# Patient Record
Sex: Female | Born: 1955
Health system: Southern US, Community
[De-identification: ages and names within clinical notes are randomized; demographics above are authoritative.]

## PROBLEM LIST (undated history)

## (undated) DIAGNOSIS — I4510 Unspecified right bundle-branch block: Secondary | ICD-10-CM

## (undated) DIAGNOSIS — G629 Polyneuropathy, unspecified: Secondary | ICD-10-CM

## (undated) DIAGNOSIS — G514 Facial myokymia: Secondary | ICD-10-CM

## (undated) DIAGNOSIS — G930 Cerebral cysts: Secondary | ICD-10-CM

## (undated) DIAGNOSIS — E559 Vitamin D deficiency, unspecified: Secondary | ICD-10-CM

## (undated) HISTORY — DX: Polyneuropathy, unspecified: G62.9

## (undated) HISTORY — DX: Cerebral cysts: G93.0

## (undated) HISTORY — DX: Facial myokymia: G51.4

## (undated) HISTORY — PX: OTHER SURGICAL HISTORY: SHX169

## (undated) HISTORY — PX: TONSILLECTOMY: SUR1361

## (undated) HISTORY — DX: Unspecified right bundle-branch block: I45.10

## (undated) HISTORY — DX: Vitamin D deficiency, unspecified: E55.9

## (undated) HISTORY — PX: BREAST EXCISIONAL BIOPSY: SUR124

## (undated) HISTORY — PX: PILONIDAL CYST EXCISION: SHX744

---

## 1997-06-15 ENCOUNTER — Other Ambulatory Visit: Admission: RE | Admit: 1997-06-15 | Discharge: 1997-06-15 | Payer: Self-pay | Admitting: Obstetrics and Gynecology

## 1997-07-21 ENCOUNTER — Other Ambulatory Visit: Admission: RE | Admit: 1997-07-21 | Discharge: 1997-07-21 | Payer: Self-pay | Admitting: Obstetrics and Gynecology

## 1998-06-23 ENCOUNTER — Encounter
Admission: RE | Admit: 1998-06-23 | Discharge: 1998-07-08 | Payer: Self-pay | Admitting: Physical Medicine & Rehabilitation

## 1998-08-25 ENCOUNTER — Other Ambulatory Visit: Admission: RE | Admit: 1998-08-25 | Discharge: 1998-08-25 | Payer: Self-pay | Admitting: Obstetrics and Gynecology

## 1999-08-10 ENCOUNTER — Other Ambulatory Visit: Admission: RE | Admit: 1999-08-10 | Discharge: 1999-08-10 | Payer: Self-pay | Admitting: Family Medicine

## 2000-08-19 ENCOUNTER — Encounter: Admission: RE | Admit: 2000-08-19 | Discharge: 2000-08-19 | Payer: Self-pay | Admitting: Family Medicine

## 2000-08-19 ENCOUNTER — Encounter: Payer: Self-pay | Admitting: Family Medicine

## 2000-08-27 ENCOUNTER — Other Ambulatory Visit: Admission: RE | Admit: 2000-08-27 | Discharge: 2000-08-27 | Payer: Self-pay | Admitting: Family Medicine

## 2001-09-01 ENCOUNTER — Other Ambulatory Visit: Admission: RE | Admit: 2001-09-01 | Discharge: 2001-09-01 | Payer: Self-pay | Admitting: Family Medicine

## 2002-10-21 ENCOUNTER — Other Ambulatory Visit: Admission: RE | Admit: 2002-10-21 | Discharge: 2002-10-21 | Payer: Self-pay | Admitting: Family Medicine

## 2002-12-03 ENCOUNTER — Encounter: Payer: Self-pay | Admitting: Family Medicine

## 2002-12-03 ENCOUNTER — Encounter: Admission: RE | Admit: 2002-12-03 | Discharge: 2002-12-03 | Payer: Self-pay | Admitting: Family Medicine

## 2003-12-27 ENCOUNTER — Other Ambulatory Visit: Admission: RE | Admit: 2003-12-27 | Discharge: 2003-12-27 | Payer: Self-pay | Admitting: Family Medicine

## 2004-05-31 ENCOUNTER — Ambulatory Visit (HOSPITAL_COMMUNITY): Admission: RE | Admit: 2004-05-31 | Discharge: 2004-05-31 | Payer: Self-pay | Admitting: *Deleted

## 2004-07-27 ENCOUNTER — Ambulatory Visit (HOSPITAL_COMMUNITY): Admission: RE | Admit: 2004-07-27 | Discharge: 2004-07-27 | Payer: Self-pay | Admitting: Neurosurgery

## 2004-12-28 ENCOUNTER — Encounter: Admission: RE | Admit: 2004-12-28 | Discharge: 2004-12-28 | Payer: Self-pay | Admitting: Family Medicine

## 2005-01-11 ENCOUNTER — Other Ambulatory Visit: Admission: RE | Admit: 2005-01-11 | Discharge: 2005-01-11 | Payer: Self-pay | Admitting: Family Medicine

## 2005-01-15 ENCOUNTER — Ambulatory Visit (HOSPITAL_COMMUNITY): Admission: RE | Admit: 2005-01-15 | Discharge: 2005-01-15 | Payer: Self-pay | Admitting: Family Medicine

## 2005-07-28 ENCOUNTER — Ambulatory Visit (HOSPITAL_COMMUNITY): Admission: RE | Admit: 2005-07-28 | Discharge: 2005-07-28 | Payer: Self-pay | Admitting: Neurosurgery

## 2006-10-10 ENCOUNTER — Encounter: Admission: RE | Admit: 2006-10-10 | Discharge: 2006-10-10 | Payer: Self-pay | Admitting: Family Medicine

## 2006-10-24 ENCOUNTER — Encounter: Admission: RE | Admit: 2006-10-24 | Discharge: 2006-10-24 | Payer: Self-pay | Admitting: Family Medicine

## 2007-01-30 ENCOUNTER — Encounter: Admission: RE | Admit: 2007-01-30 | Discharge: 2007-01-30 | Payer: Self-pay | Admitting: Family Medicine

## 2007-12-18 ENCOUNTER — Encounter: Admission: RE | Admit: 2007-12-18 | Discharge: 2007-12-18 | Payer: Self-pay | Admitting: Family Medicine

## 2008-06-30 ENCOUNTER — Encounter: Admission: RE | Admit: 2008-06-30 | Discharge: 2008-06-30 | Payer: Self-pay | Admitting: Occupational Medicine

## 2009-06-02 ENCOUNTER — Encounter: Admission: RE | Admit: 2009-06-02 | Discharge: 2009-06-02 | Payer: Self-pay | Admitting: Family Medicine

## 2010-06-08 ENCOUNTER — Other Ambulatory Visit: Payer: Self-pay | Admitting: Gastroenterology

## 2010-06-08 ENCOUNTER — Ambulatory Visit (HOSPITAL_COMMUNITY)
Admission: RE | Admit: 2010-06-08 | Discharge: 2010-06-08 | Disposition: A | Discharge status: Home / Self Care | Payer: Managed Care, Other (non HMO) | Source: Ambulatory Visit | Attending: Gastroenterology | Admitting: Gastroenterology

## 2010-06-08 DIAGNOSIS — Z1211 Encounter for screening for malignant neoplasm of colon: Secondary | ICD-10-CM | POA: Insufficient documentation

## 2010-06-08 DIAGNOSIS — D126 Benign neoplasm of colon, unspecified: Secondary | ICD-10-CM | POA: Insufficient documentation

## 2010-06-25 NOTE — Op Note (Signed)
  NAMEADALYN, Kimberly Jacobs NO.:  [id number]  MEDICAL RECORD NO.:  [id number]           PATIENT TYPE:  O  LOCATION:  [facility]                         FACILITY:  [facility]  PHYSICIAN:  Danise Edge, M.D.   DATE OF BIRTH:  15-Mar-1955  DATE OF PROCEDURE:  06/08/2010 DATE OF DISCHARGE:                              OPERATIVE REPORT   HISTORY:  [name] is a 55 year old female born 1955/12/23.  The patient is scheduled to undergo her first screening colonoscopy with polypectomy to prevent colon cancer.  ENDOSCOPIST:  Danise Edge, M.D.  PREMEDICATION:  Fentanyl 100 mcg, Versed 10 mg.  PROCEDURE:  After obtaining informed consent, the patient was placed in the left lateral decubitus position.  Anal inspection and digital rectal examination were normal.  The Pentax pediatric colonoscope was introduced into the rectum and easily advanced to the cecum.  A normal- appearing ileocecal valve and appendiceal orifice were identified. Colonic preparation for the examination today was good.  Rectum normal.  Retroflexed view of the distal rectum normal. Sigmoid colon and descending colon.  From the distal sigmoid colon a 3- mm sessile polyp was removed with the cold biopsy forceps. Splenic flexure normal. Transverse colon normal. Hepatic flexure normal. Ascending colon normal. Cecum and ileocecal valve normal.  ASSESSMENT:  A diminutive polyp was removed from the distal sigmoid colon with the cold biopsy forceps.  Otherwise normal screening colonoscopy to the cecum.  RECOMMENDATIONS:  If the sigmoid colon polyp returns neoplastic pathologically, the patient should undergo a surveillance colonoscopy in 5 years.  If the distal sigmoid colon polyp returns non-neoplastic pathologically, the patient should undergo a screening colonoscopy in 10 years.          ______________________________ Danise Edge, M.D.     MJ/MEDQ  D:  06/08/2010  T:  06/08/2010  Job:   [id number]  cc:   Juluis Rainier, M.D. Fax: [phone]  Electronically Signed by Danise Edge M.D. on 06/25/2010 08:52:33 AM

## 2011-08-10 ENCOUNTER — Other Ambulatory Visit: Payer: Self-pay | Admitting: Family Medicine

## 2011-08-10 DIAGNOSIS — Z1231 Encounter for screening mammogram for malignant neoplasm of breast: Secondary | ICD-10-CM

## 2011-08-10 DIAGNOSIS — Z780 Asymptomatic menopausal state: Secondary | ICD-10-CM

## 2011-08-24 ENCOUNTER — Ambulatory Visit
Admission: RE | Admit: 2011-08-24 | Discharge: 2011-08-24 | Disposition: A | Discharge status: Home / Self Care | Payer: 59 | Source: Ambulatory Visit | Attending: Family Medicine | Admitting: Family Medicine

## 2011-08-24 DIAGNOSIS — Z1231 Encounter for screening mammogram for malignant neoplasm of breast: Secondary | ICD-10-CM

## 2011-08-24 DIAGNOSIS — Z780 Asymptomatic menopausal state: Secondary | ICD-10-CM

## 2012-11-03 ENCOUNTER — Other Ambulatory Visit: Payer: Self-pay

## 2012-11-03 DIAGNOSIS — Z1231 Encounter for screening mammogram for malignant neoplasm of breast: Secondary | ICD-10-CM

## 2012-11-13 ENCOUNTER — Other Ambulatory Visit (HOSPITAL_COMMUNITY)
Admission: RE | Admit: 2012-11-13 | Discharge: 2012-11-13 | Disposition: A | Discharge status: Home / Self Care | Payer: 59 | Source: Ambulatory Visit | Attending: Family Medicine | Admitting: Family Medicine

## 2012-11-13 ENCOUNTER — Other Ambulatory Visit: Payer: Self-pay | Admitting: Family Medicine

## 2012-11-13 DIAGNOSIS — Z124 Encounter for screening for malignant neoplasm of cervix: Secondary | ICD-10-CM | POA: Insufficient documentation

## 2012-11-27 ENCOUNTER — Ambulatory Visit: Payer: 59

## 2012-12-11 ENCOUNTER — Ambulatory Visit: Payer: 59

## 2013-01-01 ENCOUNTER — Ambulatory Visit
Admission: RE | Admit: 2013-01-01 | Discharge: 2013-01-01 | Disposition: A | Discharge status: Home / Self Care | Payer: 59 | Source: Ambulatory Visit

## 2013-01-01 DIAGNOSIS — Z1231 Encounter for screening mammogram for malignant neoplasm of breast: Secondary | ICD-10-CM

## 2013-04-30 ENCOUNTER — Other Ambulatory Visit: Payer: Self-pay | Admitting: Obstetrics and Gynecology

## 2013-09-04 ENCOUNTER — Other Ambulatory Visit: Payer: Self-pay | Admitting: Obstetrics and Gynecology

## 2013-09-04 DIAGNOSIS — N63 Unspecified lump in unspecified breast: Secondary | ICD-10-CM

## 2013-09-07 ENCOUNTER — Encounter (INDEPENDENT_AMBULATORY_CARE_PROVIDER_SITE_OTHER): Payer: Self-pay

## 2013-09-07 ENCOUNTER — Ambulatory Visit
Admission: RE | Admit: 2013-09-07 | Discharge: 2013-09-07 | Disposition: A | Discharge status: Home / Self Care | Payer: 59 | Source: Ambulatory Visit | Attending: Obstetrics and Gynecology | Admitting: Obstetrics and Gynecology

## 2013-09-07 DIAGNOSIS — N63 Unspecified lump in unspecified breast: Secondary | ICD-10-CM

## 2014-02-18 ENCOUNTER — Other Ambulatory Visit: Payer: Self-pay

## 2014-03-22 ENCOUNTER — Other Ambulatory Visit: Payer: Self-pay

## 2014-03-22 DIAGNOSIS — Z1231 Encounter for screening mammogram for malignant neoplasm of breast: Secondary | ICD-10-CM

## 2014-04-01 ENCOUNTER — Ambulatory Visit
Admission: RE | Admit: 2014-04-01 | Discharge: 2014-04-01 | Disposition: A | Discharge status: Home / Self Care | Payer: 59 | Source: Ambulatory Visit

## 2014-04-01 DIAGNOSIS — Z1231 Encounter for screening mammogram for malignant neoplasm of breast: Secondary | ICD-10-CM

## 2014-04-02 ENCOUNTER — Other Ambulatory Visit: Payer: Self-pay | Admitting: Obstetrics and Gynecology

## 2014-04-02 DIAGNOSIS — R928 Other abnormal and inconclusive findings on diagnostic imaging of breast: Secondary | ICD-10-CM

## 2014-04-12 ENCOUNTER — Ambulatory Visit
Admission: RE | Admit: 2014-04-12 | Discharge: 2014-04-12 | Disposition: A | Discharge status: Home / Self Care | Payer: 59 | Source: Ambulatory Visit | Attending: Obstetrics and Gynecology | Admitting: Obstetrics and Gynecology

## 2014-04-12 DIAGNOSIS — R928 Other abnormal and inconclusive findings on diagnostic imaging of breast: Secondary | ICD-10-CM

## 2014-04-15 ENCOUNTER — Other Ambulatory Visit: Payer: Self-pay | Admitting: Obstetrics and Gynecology

## 2014-04-16 LAB — CYTOLOGY - PAP

## 2014-09-02 ENCOUNTER — Other Ambulatory Visit: Payer: Self-pay | Admitting: Obstetrics and Gynecology

## 2014-09-02 DIAGNOSIS — N63 Unspecified lump in unspecified breast: Secondary | ICD-10-CM

## 2014-10-14 ENCOUNTER — Ambulatory Visit
Admission: RE | Admit: 2014-10-14 | Discharge: 2014-10-14 | Disposition: A | Discharge status: Home / Self Care | Payer: 59 | Source: Ambulatory Visit | Attending: Obstetrics and Gynecology | Admitting: Obstetrics and Gynecology

## 2014-10-14 DIAGNOSIS — N63 Unspecified lump in unspecified breast: Secondary | ICD-10-CM

## 2015-03-11 MED FILL — ESTRADIOL PATCH 0.0375: 0.0375 | 84 days supply | Qty: 24 | Fill #0

## 2015-05-05 ENCOUNTER — Other Ambulatory Visit: Payer: Self-pay | Admitting: Obstetrics and Gynecology

## 2015-05-05 DIAGNOSIS — N6310 Unspecified lump in the right breast, unspecified quadrant: Secondary | ICD-10-CM

## 2015-05-12 ENCOUNTER — Ambulatory Visit
Admission: RE | Admit: 2015-05-12 | Discharge: 2015-05-12 | Disposition: A | Discharge status: Home / Self Care | Payer: 59 | Source: Ambulatory Visit | Attending: Obstetrics and Gynecology | Admitting: Obstetrics and Gynecology

## 2015-05-12 ENCOUNTER — Other Ambulatory Visit: Payer: Self-pay | Admitting: Obstetrics and Gynecology

## 2015-05-12 DIAGNOSIS — N6310 Unspecified lump in the right breast, unspecified quadrant: Secondary | ICD-10-CM

## 2015-05-12 DIAGNOSIS — N63 Unspecified lump in breast: Secondary | ICD-10-CM | POA: Diagnosis not present

## 2015-05-16 ENCOUNTER — Other Ambulatory Visit: Payer: Self-pay | Admitting: Obstetrics and Gynecology

## 2015-05-16 DIAGNOSIS — Z124 Encounter for screening for malignant neoplasm of cervix: Secondary | ICD-10-CM | POA: Diagnosis not present

## 2015-05-16 DIAGNOSIS — Z681 Body mass index (BMI) 19 or less, adult: Secondary | ICD-10-CM | POA: Diagnosis not present

## 2015-05-16 DIAGNOSIS — Z01419 Encounter for gynecological examination (general) (routine) without abnormal findings: Secondary | ICD-10-CM | POA: Diagnosis not present

## 2015-05-17 LAB — CYTOLOGY - PAP

## 2015-05-24 ENCOUNTER — Ambulatory Visit
Admission: RE | Admit: 2015-05-24 | Discharge: 2015-05-24 | Disposition: A | Discharge status: Home / Self Care | Payer: 59 | Source: Ambulatory Visit | Attending: Obstetrics and Gynecology | Admitting: Obstetrics and Gynecology

## 2015-05-24 ENCOUNTER — Other Ambulatory Visit: Payer: Self-pay | Admitting: Obstetrics and Gynecology

## 2015-05-24 DIAGNOSIS — N6310 Unspecified lump in the right breast, unspecified quadrant: Secondary | ICD-10-CM

## 2015-05-24 DIAGNOSIS — N6011 Diffuse cystic mastopathy of right breast: Secondary | ICD-10-CM | POA: Diagnosis not present

## 2015-05-24 DIAGNOSIS — N63 Unspecified lump in breast: Secondary | ICD-10-CM | POA: Diagnosis not present

## 2015-06-08 MED FILL — PROGESTERONE 200 MG CAPSULE: 200 | 90 days supply | Qty: 90 | Fill #2

## 2015-06-08 MED FILL — ESTRADIOL PATCH 0.0375: 0.0375 | 84 days supply | Qty: 24 | Fill #0

## 2015-06-13 DIAGNOSIS — N950 Postmenopausal bleeding: Secondary | ICD-10-CM | POA: Diagnosis not present

## 2015-07-29 ENCOUNTER — Other Ambulatory Visit: Payer: Self-pay | Admitting: Obstetrics and Gynecology

## 2015-08-03 DIAGNOSIS — Z01810 Encounter for preprocedural cardiovascular examination: Secondary | ICD-10-CM | POA: Diagnosis not present

## 2015-08-03 DIAGNOSIS — R9431 Abnormal electrocardiogram [ECG] [EKG]: Secondary | ICD-10-CM | POA: Diagnosis not present

## 2015-08-03 DIAGNOSIS — N939 Abnormal uterine and vaginal bleeding, unspecified: Secondary | ICD-10-CM | POA: Diagnosis not present

## 2015-08-03 DIAGNOSIS — R011 Cardiac murmur, unspecified: Secondary | ICD-10-CM | POA: Diagnosis not present

## 2015-08-19 DIAGNOSIS — R9431 Abnormal electrocardiogram [ECG] [EKG]: Secondary | ICD-10-CM | POA: Diagnosis not present

## 2015-08-19 DIAGNOSIS — Z01810 Encounter for preprocedural cardiovascular examination: Secondary | ICD-10-CM | POA: Diagnosis not present

## 2015-09-05 DIAGNOSIS — R9431 Abnormal electrocardiogram [ECG] [EKG]: Secondary | ICD-10-CM | POA: Diagnosis not present

## 2015-09-05 DIAGNOSIS — Z01810 Encounter for preprocedural cardiovascular examination: Secondary | ICD-10-CM | POA: Diagnosis not present

## 2015-09-23 MED FILL — PROGESTERONE 200 MG CAPSULE: 200 | 90 days supply | Qty: 90 | Fill #3

## 2015-11-08 ENCOUNTER — Other Ambulatory Visit: Payer: Self-pay | Admitting: Obstetrics and Gynecology

## 2015-11-08 DIAGNOSIS — N950 Postmenopausal bleeding: Secondary | ICD-10-CM | POA: Diagnosis not present

## 2015-11-08 DIAGNOSIS — N858 Other specified noninflammatory disorders of uterus: Secondary | ICD-10-CM | POA: Diagnosis not present

## 2015-11-24 DIAGNOSIS — Z0000 Encounter for general adult medical examination without abnormal findings: Secondary | ICD-10-CM | POA: Diagnosis not present

## 2015-11-24 DIAGNOSIS — E559 Vitamin D deficiency, unspecified: Secondary | ICD-10-CM | POA: Diagnosis not present

## 2015-11-24 DIAGNOSIS — R7301 Impaired fasting glucose: Secondary | ICD-10-CM | POA: Diagnosis not present

## 2015-11-24 DIAGNOSIS — G629 Polyneuropathy, unspecified: Secondary | ICD-10-CM | POA: Diagnosis not present

## 2015-11-24 DIAGNOSIS — Z79890 Hormone replacement therapy (postmenopausal): Secondary | ICD-10-CM | POA: Diagnosis not present

## 2015-11-28 MED FILL — ESTRADIOL 0.025 MG PATCH: 0.025 | 84 days supply | Qty: 24 | Fill #0

## 2015-12-29 MED FILL — PROGESTERONE 200 MG CAPSULE: 200 | 90 days supply | Qty: 90 | Fill #0

## 2016-03-28 MED FILL — PROGESTERONE 200 MG CAPSULE: 200 | 90 days supply | Qty: 90 | Fill #1

## 2016-03-28 MED FILL — ESTRADIOL 0.025 MG PATCH: 0.025 | 84 days supply | Qty: 24 | Fill #1

## 2016-06-25 MED FILL — ESTRADIOL 0.025 MG PATCH: 0.025 | 84 days supply | Qty: 24 | Fill #0

## 2016-06-25 MED FILL — PROGESTERONE 200 MG CAPSULE: 200 | 90 days supply | Qty: 90 | Fill #0

## 2016-07-19 MED FILL — CLINDAMYCIN HCL 300 MG CAP: 300 | 6 days supply | Qty: 21 | Fill #0

## 2016-09-24 MED FILL — PROGESTERONE 200 MG CAPSULE: 200 | 90 days supply | Qty: 90 | Fill #1

## 2016-09-24 MED FILL — ESTRADIOL 0.025 MG PATCH: 0.025 | 84 days supply | Qty: 24 | Fill #1

## 2016-10-03 DIAGNOSIS — N898 Other specified noninflammatory disorders of vagina: Secondary | ICD-10-CM | POA: Diagnosis not present

## 2016-10-03 DIAGNOSIS — R350 Frequency of micturition: Secondary | ICD-10-CM | POA: Diagnosis not present

## 2016-10-11 MED FILL — PREMARIN VAGINAL CREAM-APPL: 0.625 | 90 days supply | Qty: 30 | Fill #0

## 2016-11-29 DIAGNOSIS — Z0000 Encounter for general adult medical examination without abnormal findings: Secondary | ICD-10-CM | POA: Diagnosis not present

## 2016-11-29 DIAGNOSIS — R7301 Impaired fasting glucose: Secondary | ICD-10-CM | POA: Diagnosis not present

## 2016-11-29 DIAGNOSIS — E2839 Other primary ovarian failure: Secondary | ICD-10-CM | POA: Diagnosis not present

## 2016-11-29 DIAGNOSIS — E559 Vitamin D deficiency, unspecified: Secondary | ICD-10-CM | POA: Diagnosis not present

## 2016-11-29 MED FILL — HYDROCORT-PRAMOXINE 2.5-1%: 2.5-1 | 10 days supply | Qty: 30 | Fill #0

## 2016-12-03 ENCOUNTER — Other Ambulatory Visit: Payer: Self-pay | Admitting: Family Medicine

## 2016-12-03 DIAGNOSIS — E2839 Other primary ovarian failure: Secondary | ICD-10-CM

## 2016-12-03 DIAGNOSIS — Z1231 Encounter for screening mammogram for malignant neoplasm of breast: Secondary | ICD-10-CM

## 2016-12-24 MED FILL — PROGESTERONE 200 MG CAPSULE: 200 | 90 days supply | Qty: 90 | Fill #0

## 2016-12-24 MED FILL — ESTRADIOL 0.025 MG PATCH: 0.025 | 84 days supply | Qty: 24 | Fill #0

## 2016-12-26 ENCOUNTER — Ambulatory Visit
Admission: RE | Admit: 2016-12-26 | Discharge: 2016-12-26 | Disposition: A | Discharge status: Home / Self Care | Payer: 59 | Source: Ambulatory Visit | Attending: Family Medicine | Admitting: Family Medicine

## 2016-12-26 DIAGNOSIS — Z13820 Encounter for screening for osteoporosis: Secondary | ICD-10-CM | POA: Diagnosis not present

## 2016-12-26 DIAGNOSIS — Z1231 Encounter for screening mammogram for malignant neoplasm of breast: Secondary | ICD-10-CM

## 2016-12-26 DIAGNOSIS — Z780 Asymptomatic menopausal state: Secondary | ICD-10-CM | POA: Diagnosis not present

## 2016-12-26 DIAGNOSIS — E2839 Other primary ovarian failure: Secondary | ICD-10-CM

## 2016-12-27 ENCOUNTER — Other Ambulatory Visit: Payer: Self-pay | Admitting: Family Medicine

## 2016-12-27 DIAGNOSIS — H5212 Myopia, left eye: Secondary | ICD-10-CM | POA: Diagnosis not present

## 2016-12-27 DIAGNOSIS — H52223 Regular astigmatism, bilateral: Secondary | ICD-10-CM | POA: Diagnosis not present

## 2016-12-27 DIAGNOSIS — H524 Presbyopia: Secondary | ICD-10-CM | POA: Diagnosis not present

## 2016-12-27 DIAGNOSIS — R928 Other abnormal and inconclusive findings on diagnostic imaging of breast: Secondary | ICD-10-CM

## 2017-01-08 ENCOUNTER — Ambulatory Visit
Admission: RE | Admit: 2017-01-08 | Discharge: 2017-01-08 | Disposition: A | Discharge status: Home / Self Care | Payer: 59 | Source: Ambulatory Visit | Attending: Family Medicine | Admitting: Family Medicine

## 2017-01-08 DIAGNOSIS — N6011 Diffuse cystic mastopathy of right breast: Secondary | ICD-10-CM | POA: Diagnosis not present

## 2017-01-08 DIAGNOSIS — R922 Inconclusive mammogram: Secondary | ICD-10-CM | POA: Diagnosis not present

## 2017-01-08 DIAGNOSIS — R928 Other abnormal and inconclusive findings on diagnostic imaging of breast: Secondary | ICD-10-CM

## 2017-01-17 DIAGNOSIS — H16143 Punctate keratitis, bilateral: Secondary | ICD-10-CM | POA: Diagnosis not present

## 2017-01-17 DIAGNOSIS — H2513 Age-related nuclear cataract, bilateral: Secondary | ICD-10-CM | POA: Diagnosis not present

## 2017-03-25 MED FILL — PROGESTERONE 200 MG CAPSULE: 200 | 90 days supply | Qty: 90 | Fill #1

## 2017-03-25 MED FILL — ESTRADIOL 0.025 MG PATCH: 0.025 | 84 days supply | Qty: 24 | Fill #1

## 2017-04-03 IMAGING — US US BREAST LTD UNI RIGHT INC AXILLA
1 series · 7 of 7 positions shown · non-contrast
Comparison: Previous exam(s).

CLINICAL DATA: Follow-up of probably benign right breast 530
o'clock nodule.

EXAM:
ULTRASOUND OF THE RIGHT BREAST

[Series 1: us breast ltd uni right inc axilla · 0.06mm/px · 7 of 7 slices shown]
[im 1/7]
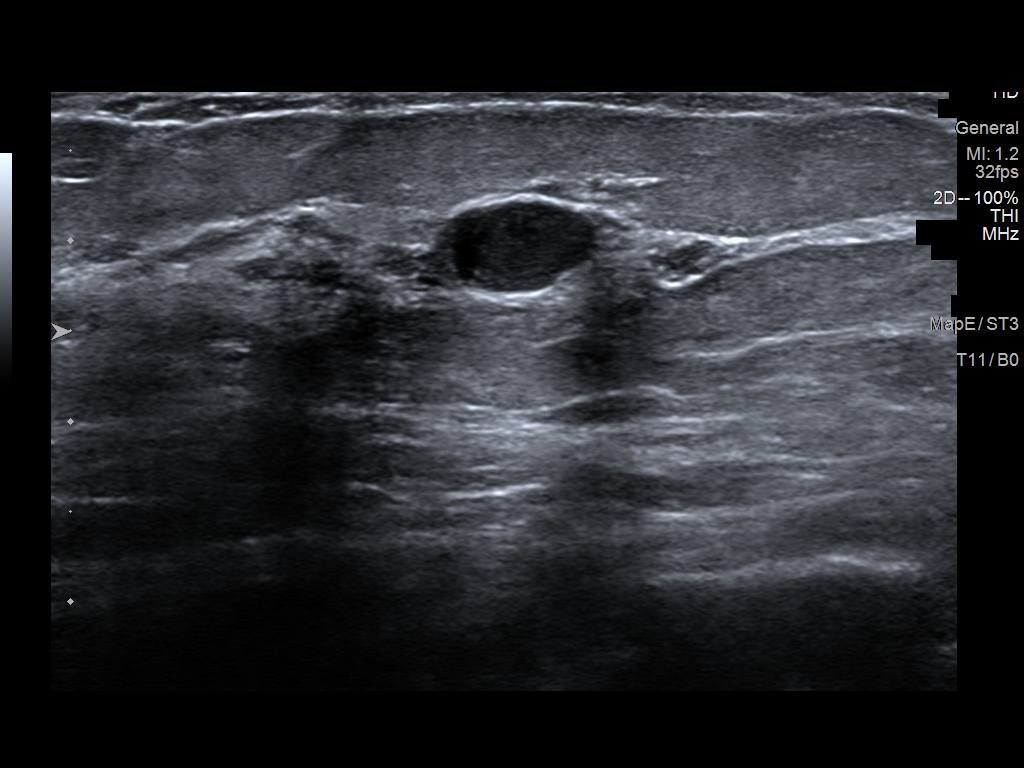
[im 2/7]
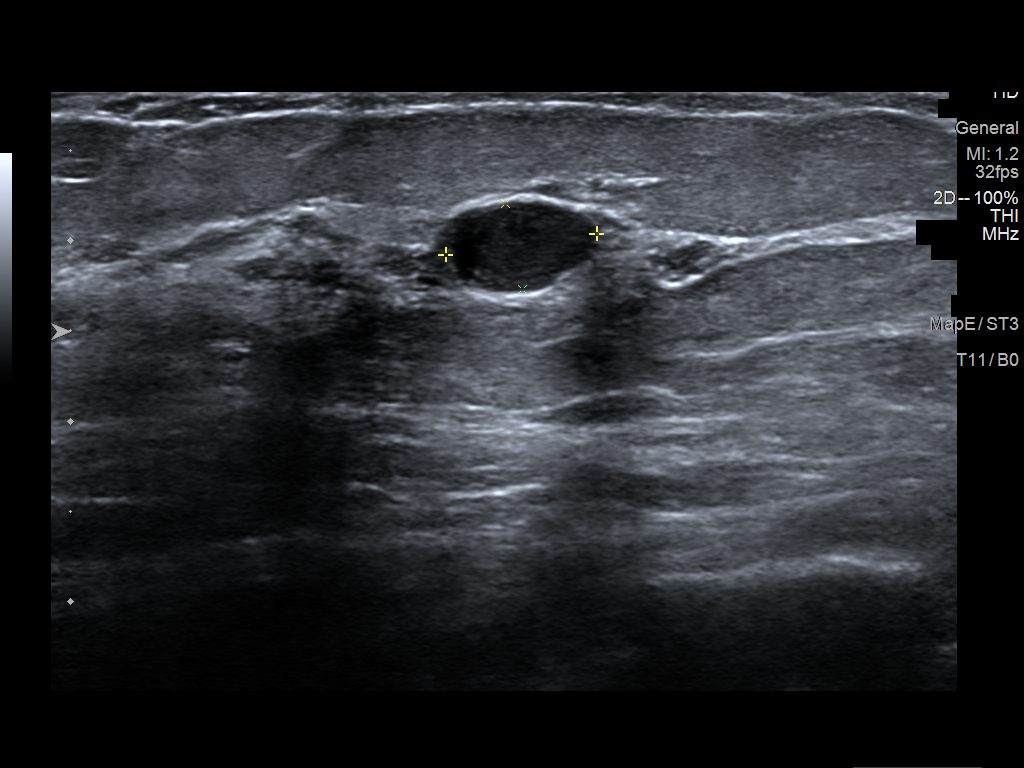
[im 3/7]
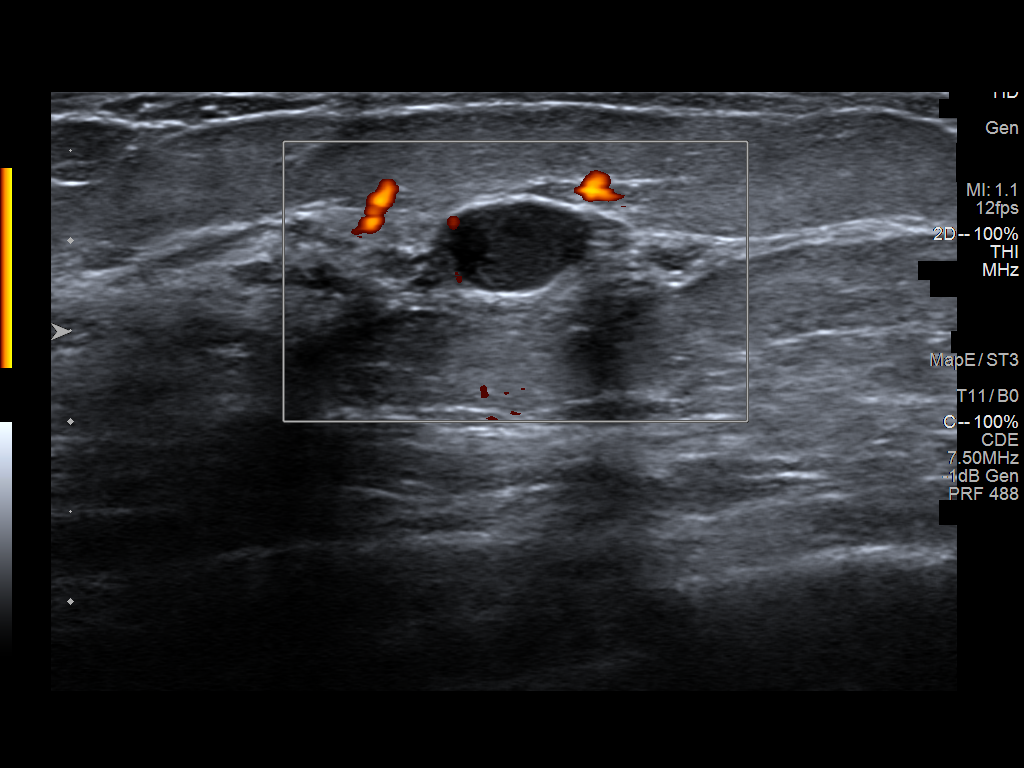
[im 4/7]
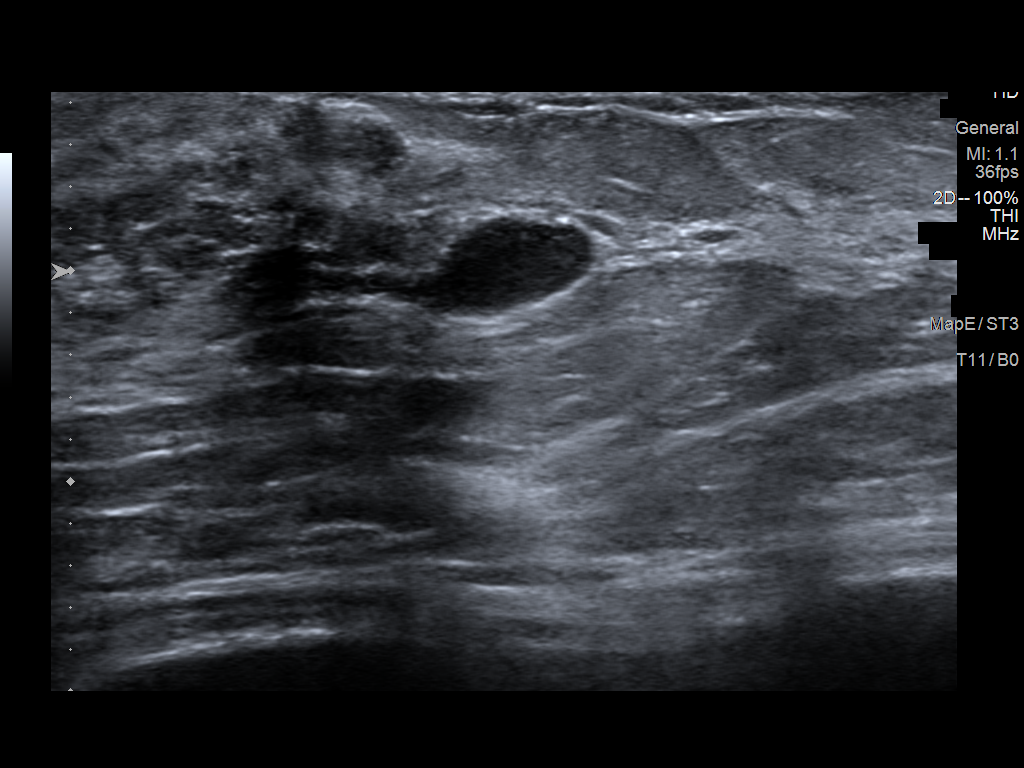
[im 5/7]
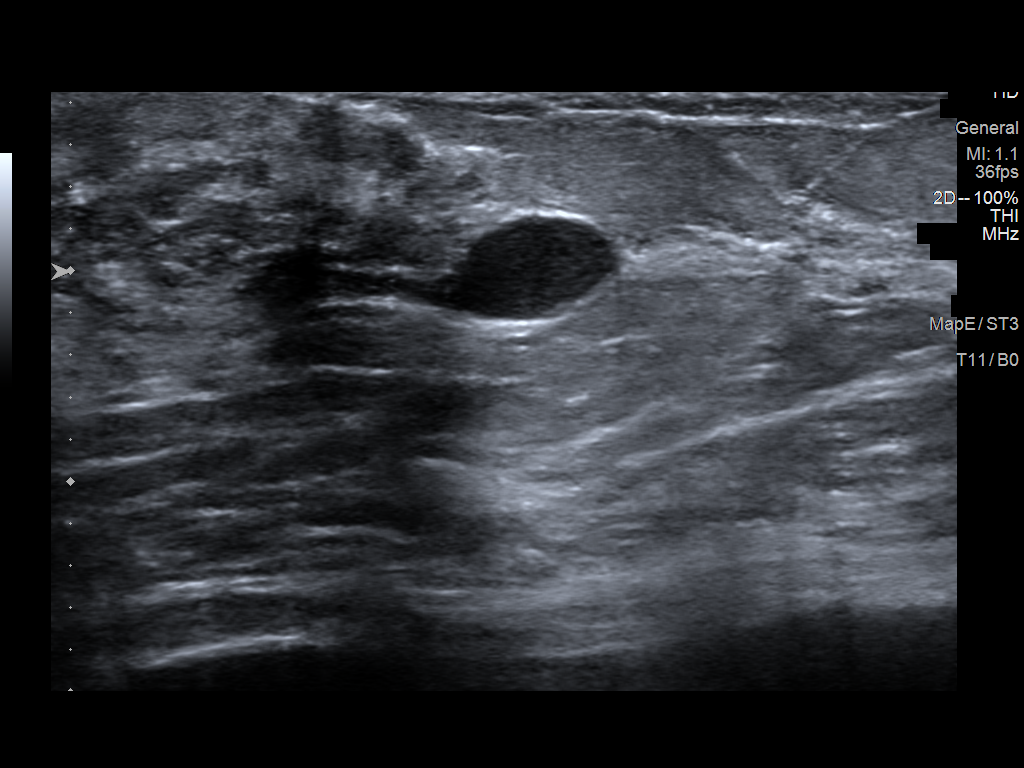
[im 6/7]
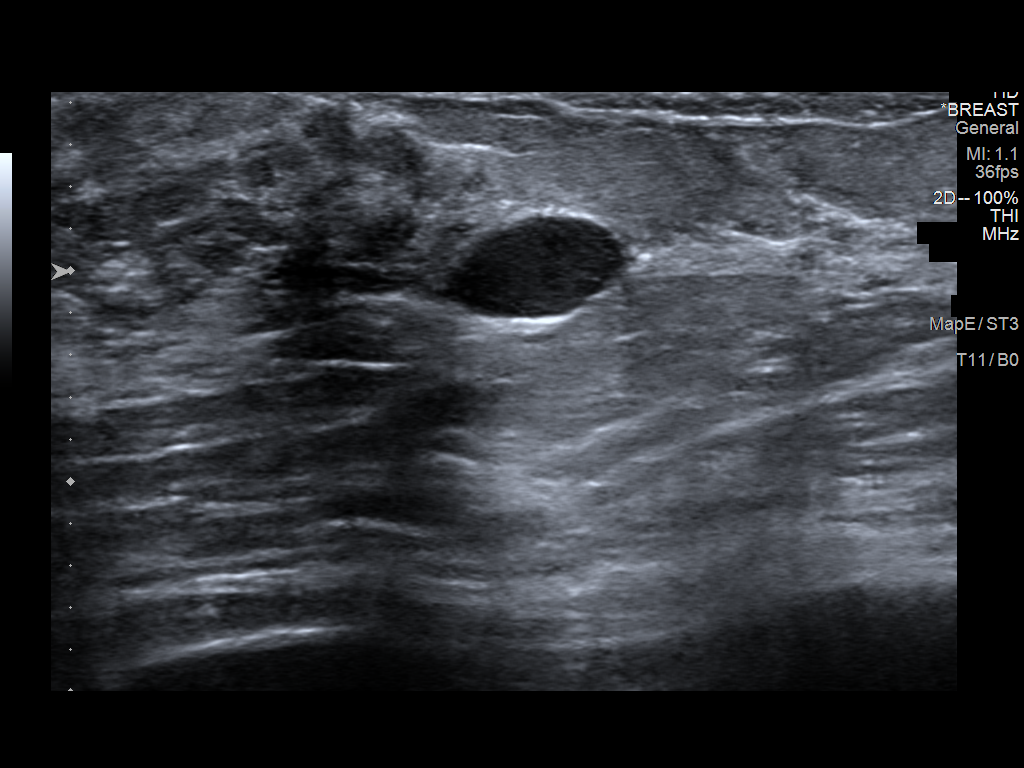
[im 7/7]
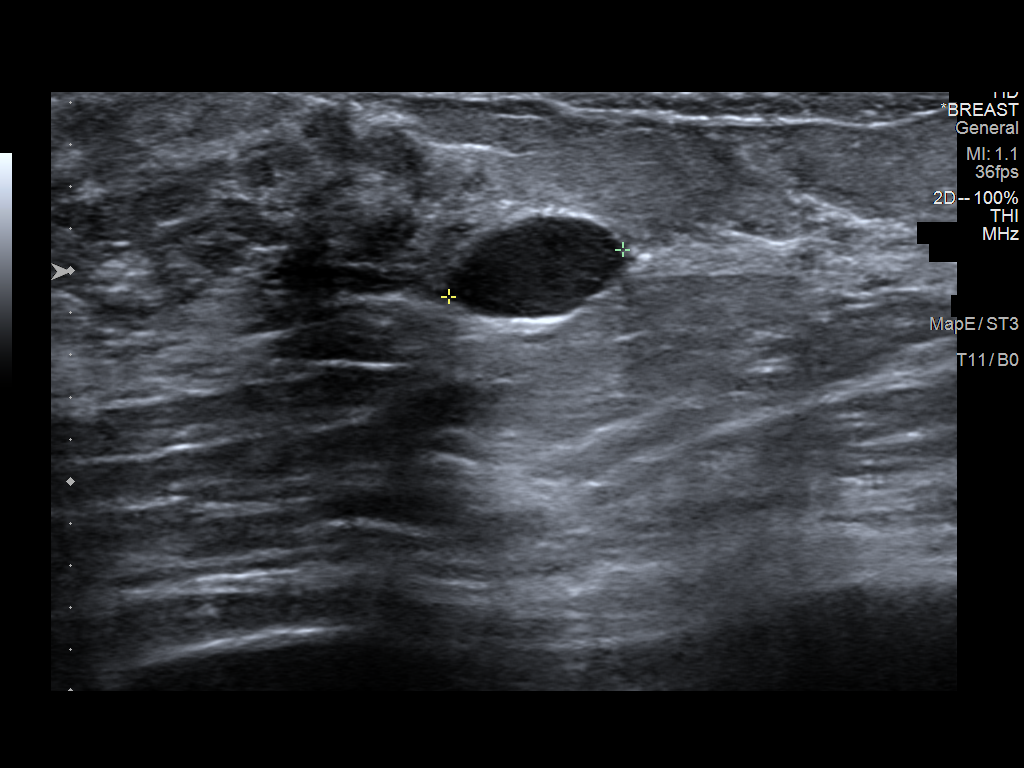

[7 of 7 positions shown; findings below may reference images not displayed]

FINDINGS: On physical exam, no suspicious masses of found.

Targeted ultrasound is performed, showing right breast 5:30 o'clock
1 cm from the nipple stable hypoechoic circumscribed horizontally
oriented nodule which measures 0.8 x 0.4 x 0.7 cm. The sonographic
appearance suggests benign complicated cyst or cystic dilation of a
lactiferous duct.
IMPRESSION: Stable probably benign right breast 5:30 o'clock sub cm mass.

RECOMMENDATION:
Diagnostic mammogram and possibly ultrasound of the right breast in
6 months. (Code:64-[id number])

I have discussed the findings and recommendations with the patient.
Results were also provided in writing at the conclusion of the
visit. If applicable, a reminder letter will be sent to the patient
regarding the next appointment.

BI-RADS CATEGORY  3: Probably benign finding(s) - short interval
follow-up suggested.

## 2017-06-21 MED FILL — ESTRADIOL 0.025 MG PATCH: 0.025 | 84 days supply | Qty: 24 | Fill #2

## 2017-06-21 MED FILL — PROGESTERONE MICRONIZED 200: 200 | 90 days supply | Qty: 90 | Fill #2

## 2017-09-22 MED FILL — PROGESTERONE MICRONIZED 200: 200 | 90 days supply | Qty: 90 | Fill #3

## 2017-09-22 MED FILL — ESTRADIOL 0.025 MG PATCH: 0.025 | 84 days supply | Qty: 24 | Fill #3

## 2017-10-10 DIAGNOSIS — L821 Other seborrheic keratosis: Secondary | ICD-10-CM | POA: Diagnosis not present

## 2017-12-05 DIAGNOSIS — H524 Presbyopia: Secondary | ICD-10-CM | POA: Diagnosis not present

## 2017-12-05 DIAGNOSIS — Z23 Encounter for immunization: Secondary | ICD-10-CM | POA: Diagnosis not present

## 2017-12-05 DIAGNOSIS — E559 Vitamin D deficiency, unspecified: Secondary | ICD-10-CM | POA: Diagnosis not present

## 2017-12-05 DIAGNOSIS — Z0000 Encounter for general adult medical examination without abnormal findings: Secondary | ICD-10-CM | POA: Diagnosis not present

## 2017-12-05 DIAGNOSIS — R7301 Impaired fasting glucose: Secondary | ICD-10-CM | POA: Diagnosis not present

## 2017-12-05 MED FILL — RESTASIS 0.05% EYE EMULSION: 0.05 | 90 days supply | Qty: 180 | Fill #0

## 2017-12-05 MED FILL — PROGESTERONE 200 MG CAPSULE: 200 | 90 days supply | Qty: 90 | Fill #0

## 2017-12-05 MED FILL — DOTTI 0.025 MG/24HR PTTW: 0.025 | 84 days supply | Qty: 24 | Fill #0

## 2017-12-26 DIAGNOSIS — Z124 Encounter for screening for malignant neoplasm of cervix: Secondary | ICD-10-CM | POA: Diagnosis not present

## 2017-12-26 DIAGNOSIS — Z01419 Encounter for gynecological examination (general) (routine) without abnormal findings: Secondary | ICD-10-CM | POA: Diagnosis not present

## 2017-12-26 MED FILL — PREMARIN VAGINAL CREAM-APPL: 0.625 | 90 days supply | Qty: 30 | Fill #0

## 2018-02-20 ENCOUNTER — Ambulatory Visit
Admission: RE | Admit: 2018-02-20 | Discharge: 2018-02-20 | Disposition: A | Discharge status: Home / Self Care | Payer: 59 | Source: Ambulatory Visit

## 2018-02-20 ENCOUNTER — Other Ambulatory Visit: Payer: Self-pay | Admitting: Family Medicine

## 2018-02-20 DIAGNOSIS — Z1231 Encounter for screening mammogram for malignant neoplasm of breast: Secondary | ICD-10-CM | POA: Diagnosis not present

## 2018-03-06 MED FILL — PROGESTERONE 200 MG CAPSULE: 200 | 90 days supply | Qty: 90 | Fill #1

## 2018-06-06 MED FILL — PROGESTERONE 200 MG CAPSULE: 200 | 90 days supply | Qty: 90 | Fill #0

## 2018-12-09 MED FILL — PREMARIN VAGINAL CREAM-APPL: 0.625 | 90 days supply | Qty: 30 | Fill #1

## 2019-01-19 DIAGNOSIS — R7301 Impaired fasting glucose: Secondary | ICD-10-CM | POA: Diagnosis not present

## 2019-01-19 DIAGNOSIS — E559 Vitamin D deficiency, unspecified: Secondary | ICD-10-CM | POA: Diagnosis not present

## 2019-01-19 DIAGNOSIS — Z0000 Encounter for general adult medical examination without abnormal findings: Secondary | ICD-10-CM | POA: Diagnosis not present

## 2019-06-09 ENCOUNTER — Other Ambulatory Visit: Payer: Self-pay | Admitting: Family Medicine

## 2019-06-09 DIAGNOSIS — Z1231 Encounter for screening mammogram for malignant neoplasm of breast: Secondary | ICD-10-CM

## 2019-06-26 ENCOUNTER — Ambulatory Visit
Admission: RE | Admit: 2019-06-26 | Discharge: 2019-06-26 | Disposition: A | Discharge status: Home / Self Care | Payer: 59 | Source: Ambulatory Visit | Attending: Family Medicine | Admitting: Family Medicine

## 2019-06-26 ENCOUNTER — Other Ambulatory Visit: Payer: Self-pay

## 2019-06-26 DIAGNOSIS — Z1231 Encounter for screening mammogram for malignant neoplasm of breast: Secondary | ICD-10-CM

## 2019-06-29 ENCOUNTER — Other Ambulatory Visit: Payer: Self-pay | Admitting: Family Medicine

## 2019-06-29 DIAGNOSIS — R928 Other abnormal and inconclusive findings on diagnostic imaging of breast: Secondary | ICD-10-CM

## 2019-07-03 ENCOUNTER — Other Ambulatory Visit: Payer: Self-pay

## 2019-07-03 ENCOUNTER — Ambulatory Visit
Admission: RE | Admit: 2019-07-03 | Discharge: 2019-07-03 | Disposition: A | Discharge status: Home / Self Care | Payer: Managed Care, Other (non HMO) | Source: Ambulatory Visit | Attending: Family Medicine | Admitting: Family Medicine

## 2019-07-03 ENCOUNTER — Other Ambulatory Visit: Payer: Self-pay | Admitting: Family Medicine

## 2019-07-03 DIAGNOSIS — R928 Other abnormal and inconclusive findings on diagnostic imaging of breast: Secondary | ICD-10-CM

## 2019-07-03 DIAGNOSIS — R921 Mammographic calcification found on diagnostic imaging of breast: Secondary | ICD-10-CM

## 2020-01-04 ENCOUNTER — Ambulatory Visit
Admission: RE | Admit: 2020-01-04 | Discharge: 2020-01-04 | Disposition: A | Discharge status: Home / Self Care | Payer: Managed Care, Other (non HMO) | Source: Ambulatory Visit | Attending: Family Medicine | Admitting: Family Medicine

## 2020-01-04 ENCOUNTER — Other Ambulatory Visit: Payer: Self-pay

## 2020-01-04 ENCOUNTER — Other Ambulatory Visit: Payer: Self-pay | Admitting: Family Medicine

## 2020-01-04 DIAGNOSIS — R921 Mammographic calcification found on diagnostic imaging of breast: Secondary | ICD-10-CM

## 2020-01-18 ENCOUNTER — Ambulatory Visit
Admission: RE | Admit: 2020-01-18 | Discharge: 2020-01-18 | Disposition: A | Discharge status: Home / Self Care | Payer: Managed Care, Other (non HMO) | Source: Ambulatory Visit | Attending: Family Medicine | Admitting: Family Medicine

## 2020-01-18 ENCOUNTER — Other Ambulatory Visit: Payer: Self-pay

## 2020-01-18 DIAGNOSIS — R921 Mammographic calcification found on diagnostic imaging of breast: Secondary | ICD-10-CM

## 2020-01-18 HISTORY — PX: BREAST EXCISIONAL BIOPSY: SUR124

## 2020-01-19 ENCOUNTER — Other Ambulatory Visit: Payer: Self-pay | Admitting: Family Medicine

## 2020-01-19 DIAGNOSIS — R921 Mammographic calcification found on diagnostic imaging of breast: Secondary | ICD-10-CM

## 2020-01-29 ENCOUNTER — Other Ambulatory Visit: Payer: Self-pay

## 2020-01-29 ENCOUNTER — Ambulatory Visit
Admission: RE | Admit: 2020-01-29 | Discharge: 2020-01-29 | Disposition: A | Discharge status: Home / Self Care | Payer: Managed Care, Other (non HMO) | Source: Ambulatory Visit | Attending: Family Medicine | Admitting: Family Medicine

## 2020-01-29 DIAGNOSIS — R921 Mammographic calcification found on diagnostic imaging of breast: Secondary | ICD-10-CM

## 2020-01-29 MED ORDER — GADOBUTROL 1 MMOL/ML IV SOLN
6.0000 mL | Freq: Once | INTRAVENOUS | Status: AC | PRN
  Administered 2020-01-29: 6 mL via INTRAVENOUS

## 2020-06-13 ENCOUNTER — Other Ambulatory Visit: Payer: Self-pay | Admitting: Family Medicine

## 2020-06-13 DIAGNOSIS — Z1231 Encounter for screening mammogram for malignant neoplasm of breast: Secondary | ICD-10-CM

## 2020-08-01 ENCOUNTER — Other Ambulatory Visit: Payer: Self-pay | Admitting: Family Medicine

## 2020-08-01 DIAGNOSIS — R921 Mammographic calcification found on diagnostic imaging of breast: Secondary | ICD-10-CM

## 2020-08-12 ENCOUNTER — Other Ambulatory Visit: Payer: Self-pay

## 2020-08-12 ENCOUNTER — Ambulatory Visit
Admission: RE | Admit: 2020-08-12 | Discharge: 2020-08-12 | Disposition: A | Discharge status: Home / Self Care | Payer: Managed Care, Other (non HMO) | Source: Ambulatory Visit | Attending: Family Medicine | Admitting: Family Medicine

## 2020-08-12 DIAGNOSIS — R921 Mammographic calcification found on diagnostic imaging of breast: Secondary | ICD-10-CM

## 2021-01-11 ENCOUNTER — Ambulatory Visit (INDEPENDENT_AMBULATORY_CARE_PROVIDER_SITE_OTHER): Payer: Managed Care, Other (non HMO) | Admitting: Neurology

## 2021-01-11 ENCOUNTER — Encounter: Payer: Self-pay | Admitting: Neurology

## 2021-01-11 VITALS — BP 155/76 | HR 100 | Ht 67.25 in | Wt 133.0 lb

## 2021-01-11 DIAGNOSIS — R29810 Facial weakness: Secondary | ICD-10-CM

## 2021-01-11 DIAGNOSIS — R253 Fasciculation: Secondary | ICD-10-CM

## 2021-01-11 MED ORDER — GABAPENTIN 100 MG PO CAPS: 100.0000 mg | ORAL_CAPSULE | Freq: Three times a day (TID) | ORAL | 6 refills | Status: DC

## 2021-01-11 NOTE — Progress Notes (Signed)
Chief Complaint  Patient presents with   New Patient (Initial Visit)    Room 14 - alone. Referred for facial asymmetry and bilateral twitching (right side worse).      ASSESSMENT AND PLAN  Kimberly Jacobs is a 65 y.o. female   Bilateral facial muscle twitching,  EMG confirmed frequent muscle fasciculations, chronic neuropathic changes,  There was no neuropathic changes noted at left extensor digitorum communis muscle exam  Localized lesion to bilateral facial motor nucleus  MRI of the brain with and without contrast to rule out structural abnormality  Laboratory evaluation for any potential treatable etiology  Trial of gabapentin 100 mg 3 times a day  After discussed with patient, will refer her to [facility] neuromuscular clinic   DIAGNOSTIC DATA (LABS, IMAGING, TESTING) - I reviewed patient records, labs, notes, testing and imaging myself where available. Laboratory evaluation from [facility] February 2021 normal CBC, CMP, A1c 5.6  MEDICAL HISTORY:  Kimberly Jacobs is a 65 year old female, seen in request by her primary care physician Dr. Kathyrn Lass for evaluation of facial muscle twitching, weakness, initial evaluation was on January 11, 2021  I reviewed and summarized the referring note.PMhx.  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 Gaynell Face in 2006, had MRI of the brain with without contrast, I personally reviewed the film, there is midline inferior posterior fossa cyst, consistent with benign arachnoid cyst, follow-up MRI with without contrast in June 2007 showed no significant change  She later developed right frontalis muscle weakness, could not raise up both eyebrows, over the years, she also developed intermittent muscle facial twitching, initially mainly  involving facial muscles at bilateral cheek area, when she smiles, she noticed uncontrollable facial muscle twitching, improved by moving her facial muscle in certain ways  Over the years, facial muscle twitching become more obvious, especially since 2022, she also noticed significant facial asymmetry while smile, has more movement at right face, drawing the right mouth, towards the right side, for a while, she also has frequent right lower lip biting, which improved by pain attention to it  She denies sensory change, denies dysarthria, swallowing, no double vision, complains of dry eye, but no sensory loss, has mild high-pitched hearing loss, long history of bilateral tinnitus  Also performed EMG at bilateral facial muscle today, there was noticeable chronic neuropathic changes at bilateral orbicularis oculi, zygomatic major, frequent fasciculations at bilateral mentalis,  PHYSICAL EXAM:   Vitals:   01/11/21 0714  BP: (!) 155/76  Pulse: 100  Weight: 133 lb (60.3 kg)  Height: 5' 7.25" (1.708 m)   Not recorded     Body mass index is 20.68 kg/m.  PHYSICAL EXAMNIATION:  Gen: NAD, conversant, well nourised, well groomed                     Cardiovascular: Regular rate rhythm, no peripheral edema, warm, nontender. Eyes: Conjunctivae clear without exudates or hemorrhage Neck: Supple, no carotid bruits. Pulmonary: Clear to auscultation bilaterally   NEUROLOGICAL EXAM:  MENTAL STATUS: Speech:    Speech is normal; fluent and spontaneous with normal comprehension.  Cognition:     Orientation to time, place and person     Normal recent and remote memory     Normal Attention span and concentration     Normal Language,  naming, repeating,spontaneous speech     Fund of knowledge   CRANIAL NERVES: CN II: Visual fields are full to confrontation. Pupils are round equal and briskly reactive to light. CN III, IV, VI: extraocular movement are normal. No ptosis. CN V: Facial sensation is  intact to light touch, bilateral corneal reflex are present CN VII: There was no bilateral frontalis muscle movement, facial asymmetry will smile, drawing right mouth corner towards the right side, facial muscle twitching/fasciculation will activate the muscle, there was also noticeable bilateral mentalis muscle fasciculations, CN VIII: Hearing is normal to causal conversation. CN IX, X: Phonation is normal. CN XI: Head turning and shoulder shrug are intact  MOTOR: There is no pronator drift of out-stretched arms. Muscle bulk and tone are normal. Muscle strength is normal.  REFLEXES: Reflexes are 2+ and symmetric at the biceps, triceps, knees, and ankles. Plantar responses are flexor.  SENSORY: Intact to light touch, pinprick and vibratory sensation are intact in fingers and toes.  COORDINATION: There is no trunk or limb dysmetria noted.  GAIT/STANCE: Posture is normal. Gait is steady with normal steps, base, arm swing, and turning. Heel and toe walking are normal. Tandem gait is normal.  Romberg is absent.  REVIEW OF SYSTEMS:  Full 14 system review of systems performed and notable only for as above All other review of systems were negative.   ALLERGIES: Allergies  Allergen Reactions   Cefuroxime Diarrhea   Clarithromycin Diarrhea   Erythromycin Rash   Levofloxacin Diarrhea   Penicillins Rash    HOME MEDICATIONS: Current Outpatient Medications  Medication Sig Dispense Refill   CALCIUM PO Take 2 tablets by mouth daily.     Cholecalciferol 50 MCG (2000 UT) CAPS Take 1 tablet by mouth daily.     Coenzyme Q10 (CO Q-10 PO) Take 50 mg by mouth daily.     conjugated estrogens (PREMARIN) vaginal cream 1 application 2 (two) times a week.     Cyanocobalamin (VITAMIN B12) 1000 MCG TBCR Take 1 tablet by mouth daily.     Multiple Vitamin (MULTI-VITAMIN DAILY) TABS Take 1 tablet by mouth daily.     Multiple Vitamins-Minerals (ZINC PO) Take 30 mg by mouth daily.     No current  facility-administered medications for this visit.    PAST MEDICAL HISTORY: Past Medical History:  Diagnosis Date   Arachnoid cyst    Facial twitching    Peripheral neuropathy    Right bundle branch block    Vitamin D deficiency     PAST SURGICAL HISTORY: Past Surgical History:  Procedure Laterality Date   BREAST EXCISIONAL BIOPSY Left    EXCISION BLOCKED SALIVARY GLAND     PILONIDAL CYST EXCISION     TONSILLECTOMY      FAMILY HISTORY: Family History  Problem Relation Age of Onset   COPD Mother    Lung cancer Father    Breast cancer Neg Hx     SOCIAL HISTORY: Social History   Socioeconomic History   Marital status: Married    Spouse name: Not on file   Number of children: 0   Years of education: college   Highest education level: Not on file  Occupational History   Occupation: Retired Nurse, children's  Tobacco Use   Smoking status: Never   Smokeless tobacco: Never  Substance and Sexual Activity   Alcohol use: Yes    Comment: occasional wine   Drug use: Never   Sexual activity: Not on file  Other Topics Concern   Not on  file  Social History Narrative   Lives at home with husband.   Right-handed.   One cup coffee daily, occasional tea.   Social Determinants of Health   Financial Resource Strain: Not on file  Food Insecurity: Not on file  Transportation Needs: Not on file  Physical Activity: Not on file  Stress: Not on file  Social Connections: Not on file  Intimate Partner Violence: Not on file      Marcial Pacas, M.D. Ph.D.  [facility] [address], [place], [place] [address] Ph: [phone] Fax: [phone]  CC:  Kathyrn Lass, MD [place],  [place] [address]  Kathyrn Lass, MD

## 2021-01-11 NOTE — Procedures (Signed)
   History: 65 year old female, presenting with progressive worsening bilateral facial muscle twitching, bilateral frontalis muscle weakness,  Electromyography: Selected needle examination was performed at bilateral orbicularis oculi, frontalis muscle, zygomatic major, mentalis muscle.  There is no significantly decreased insertional activity at bilateral frontalis muscle, there was no volunteer motor unit activation noted.  Bilateral zygomatic major, mentalis muscle, increased insertional activity, frequent muscle fasciculations, enlarged complex motor unit potential with decreased recruitment patterns.  Left extensor digitorum communis: normal insertional activity, no spontaneous activity, normal morphology motor unit potential with normal recruitment patterns.  Conclusion: This is an abnormal study, there is electrodiagnostic evidence of chronic neuropathic changes involving bilateral facial motor nuclei innervated muscles, with evidence of frequent fasciculations.  Above findings support bilateral facial motor nuclei motor neuron involvement.  There is no evidence of left cervical myotome involvement.  Marcial Pacas, M.D. Ph.D.  [facility] [place], [place] [address] Phone: [phone] Fax:      [phone]

## 2021-01-11 NOTE — Addendum Note (Signed)
Addended by: Marcial Pacas on: 01/11/2021 09:49 AM   Modules accepted: Orders

## 2021-01-16 ENCOUNTER — Telehealth: Payer: Self-pay | Admitting: Neurology

## 2021-01-16 ENCOUNTER — Encounter: Payer: Self-pay | Admitting: Neurology

## 2021-01-16 LAB — ANA W/REFLEX IF POSITIVE: Anti Nuclear Antibody (ANA): NEGATIVE

## 2021-01-16 LAB — SEDIMENTATION RATE: Sed Rate: 2 mm/hr (ref 0–40)

## 2021-01-16 LAB — MULTIPLE MYELOMA PANEL, SERUM
Albumin SerPl Elph-Mcnc: 4 g/dL (ref 2.9–4.4)
Albumin/Glob SerPl: 1.4 (ref 0.7–1.7)
Alpha 1: 0.2 g/dL (ref 0.0–0.4)
Alpha2 Glob SerPl Elph-Mcnc: 0.8 g/dL (ref 0.4–1.0)
B-Globulin SerPl Elph-Mcnc: 1 g/dL (ref 0.7–1.3)
Gamma Glob SerPl Elph-Mcnc: 0.9 g/dL (ref 0.4–1.8)
Globulin, Total: 2.9 g/dL (ref 2.2–3.9)
IgA/Immunoglobulin A, Serum: 99 mg/dL (ref 87–352)
IgG (Immunoglobin G), Serum: 939 mg/dL (ref 586–1602)
IgM (Immunoglobulin M), Srm: 96 mg/dL (ref 26–217)
Total Protein: 6.9 g/dL (ref 6.0–8.5)

## 2021-01-16 LAB — LYME DISEASE SEROLOGY W/REFLEX: Lyme Total Antibody EIA: NEGATIVE

## 2021-01-16 LAB — VITAMIN B12: Vitamin B-12: 1140 pg/mL (ref 232–1245)

## 2021-01-16 LAB — CK: Total CK: 90 U/L (ref 32–182)

## 2021-01-16 LAB — C-REACTIVE PROTEIN: CRP: 3 mg/L (ref 0–10)

## 2021-01-16 LAB — RPR: RPR Ser Ql: NONREACTIVE

## 2021-01-16 LAB — TSH: TSH: 2.59 u[IU]/mL (ref 0.450–4.500)

## 2021-01-16 NOTE — Telephone Encounter (Signed)
[employer] order sent to [facility], they will obtain the auth and reach out to the patient to schedule.

## 2021-01-21 ENCOUNTER — Ambulatory Visit
Admission: RE | Admit: 2021-01-21 | Discharge: 2021-01-21 | Disposition: A | Discharge status: Home / Self Care | Payer: Managed Care, Other (non HMO) | Source: Ambulatory Visit | Attending: Neurology | Admitting: Neurology

## 2021-01-21 ENCOUNTER — Other Ambulatory Visit: Payer: Self-pay

## 2021-01-21 DIAGNOSIS — R253 Fasciculation: Secondary | ICD-10-CM

## 2021-01-21 DIAGNOSIS — R29810 Facial weakness: Secondary | ICD-10-CM

## 2021-01-21 MED ORDER — GADOBENATE DIMEGLUMINE 529 MG/ML IV SOLN
12.0000 mL | Freq: Once | INTRAVENOUS | Status: AC | PRN
  Administered 2021-01-21: 12 mL via INTRAVENOUS

## 2021-01-23 ENCOUNTER — Telehealth: Payer: Self-pay | Admitting: Neurology

## 2021-01-23 NOTE — Telephone Encounter (Signed)
I have called patient, MRI of the brain showed 26 x 34 x 34 nonenhancing cystic mass in the posterior fossa, consistent with a large arachnoid cyst, compared to previous scan in 2007, there is mild enlargement.  The remainder of the brain, including visible cranial nerves showed no significant abnormalities.    IMPRESSION: This MRI of the brain with and without contrast with added attention to the internal auditory canals shows the following: 1.   26 x 34 x 34 mm nonenhancing cystic mass in the posterior fossa consistent with a large arachnoid cyst.  It has enlarged from 27 mm in maximum diameter to 34 mm in maximal diameter since the 2007 MRI.  It flattens the inferior vermis though adjacent brain structures have normal signal. 2.   The remainder of the brain, including the visible cranial nerves, is normal for age. 3.   No acute findings.  Normal enhancement pattern.

## 2021-01-25 NOTE — Telephone Encounter (Signed)
Referral has been sent to [facility]. MRI results were included with referral. Phone: [phone].

## 2021-03-02 ENCOUNTER — Ambulatory Visit: Payer: Managed Care, Other (non HMO) | Admitting: Neurology

## 2021-03-27 ENCOUNTER — Other Ambulatory Visit: Payer: Self-pay | Admitting: Family Medicine

## 2021-03-27 DIAGNOSIS — Z780 Asymptomatic menopausal state: Secondary | ICD-10-CM

## 2021-03-27 DIAGNOSIS — Z9889 Other specified postprocedural states: Secondary | ICD-10-CM

## 2021-05-23 ENCOUNTER — Ambulatory Visit
Admission: RE | Admit: 2021-05-23 | Discharge: 2021-05-23 | Disposition: A | Discharge status: Home / Self Care | Payer: Medicare Other | Source: Ambulatory Visit | Attending: Sports Medicine | Admitting: Sports Medicine

## 2021-05-23 ENCOUNTER — Other Ambulatory Visit: Payer: Self-pay | Admitting: Sports Medicine

## 2021-05-23 DIAGNOSIS — M25561 Pain in right knee: Secondary | ICD-10-CM

## 2021-05-23 DIAGNOSIS — M25551 Pain in right hip: Secondary | ICD-10-CM

## 2021-08-04 ENCOUNTER — Other Ambulatory Visit: Payer: Self-pay | Admitting: Neurology

## 2021-08-08 ENCOUNTER — Encounter: Payer: Self-pay | Admitting: Neurology

## 2021-08-08 ENCOUNTER — Other Ambulatory Visit: Payer: Self-pay | Admitting: *Deleted

## 2021-08-08 MED ORDER — GABAPENTIN 100 MG PO CAPS: 100.0000 mg | ORAL_CAPSULE | Freq: Three times a day (TID) | ORAL | 5 refills | Status: DC

## 2021-08-29 ENCOUNTER — Ambulatory Visit
Admission: RE | Admit: 2021-08-29 | Discharge: 2021-08-29 | Disposition: A | Discharge status: Home / Self Care | Payer: Medicare Other | Source: Ambulatory Visit | Attending: Family Medicine | Admitting: Family Medicine

## 2021-08-29 DIAGNOSIS — Z780 Asymptomatic menopausal state: Secondary | ICD-10-CM

## 2021-08-29 DIAGNOSIS — Z9889 Other specified postprocedural states: Secondary | ICD-10-CM

## 2021-10-10 ENCOUNTER — Ambulatory Visit (INDEPENDENT_AMBULATORY_CARE_PROVIDER_SITE_OTHER): Payer: Medicare Other | Admitting: Neurology

## 2021-10-10 ENCOUNTER — Encounter: Payer: Self-pay | Admitting: Neurology

## 2021-10-10 VITALS — BP 159/80 | HR 97 | Ht 67.2 in | Wt 132.0 lb

## 2021-10-10 DIAGNOSIS — R29810 Facial weakness: Secondary | ICD-10-CM | POA: Diagnosis not present

## 2021-10-10 DIAGNOSIS — R253 Fasciculation: Secondary | ICD-10-CM | POA: Diagnosis not present

## 2021-10-10 NOTE — Progress Notes (Signed)
Chief Complaint  Patient presents with   Follow-up    Rm 14. Alone. Discuss diagnosis. C/o worsening facial weakness.      ASSESSMENT AND PLAN  Lateisha Allred Anagnos is a 66 y.o. female   Bilateral facial muscle twitching, Genetic confirming Gelsolin amyloid deposit  Her facial muscle fasciculation has been helped by gabapentin, will continue current medication  Follow-up on a yearly basis,   DIAGNOSTIC DATA (LABS, IMAGING, TESTING) - I reviewed patient records, labs, notes, testing and imaging myself where available. Laboratory evaluation from [facility] February 2021 normal CBC, CMP, A1c 5.6  MEDICAL HISTORY:  Aliena Ghrist is a 66 year old female, seen in request by her primary care physician Dr. Kathyrn Lass for evaluation of facial muscle twitching, weakness, initial evaluation was on January 11, 2021  I reviewed and summarized the referring note.PMhx.  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 Gaynell Face in 2006, had MRI of the brain with without contrast, I personally reviewed the film, there is midline inferior posterior fossa cyst, consistent with benign arachnoid cyst, follow-up MRI with without contrast in June 2007 showed no significant change  She later developed right frontalis muscle weakness, could not raise up both eyebrows, over the years, she also developed intermittent muscle facial twitching, initially mainly involving facial muscles at bilateral cheek area, when she smiles, she noticed uncontrollable facial muscle twitching, improved by moving her facial muscle in certain ways  Over the years, facial muscle twitching become more obvious, especially since 2022, she also noticed significant facial asymmetry while smile, has more movement at right face,  drawing the right mouth, towards the right side, for a while, she also has frequent right lower lip biting, which improved by pain attention to it  She denies sensory change, denies dysarthria, swallowing, no double vision, complains of dry eye, but no sensory loss, has mild high-pitched hearing loss, long history of bilateral tinnitus  Also performed EMG at bilateral facial muscle today, there was noticeable chronic neuropathic changes at bilateral orbicularis oculi, zygomatic major, frequent fasciculations at bilateral mentalis,  Update October 10, 2021: She was seen by [facility] neurologist on Jul 06, 2021, Dr Larkin Ina Mhoon, suspicious for Gelsolin variant amyloidosis, had invitae genetic testing 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 is pathologic, consistent with autosomal dominant amyloidosis, [place] Type, which is characterized by accumulation of gelsolin amyloid deposit in various tissues of the body, causing a range of clinical syndrome, which typically developing the third of fourth decades, including ophthalmological, dryness, irritability, sensitivity to light caused by amyloid deposit in the corneal (corneal lattice dystrophy CLD), over time, CLD and repetitive corneal erosion can lead to visual impairment, accumulation of amyloid deposits in nerve tissue can lead to polyneuropathy, facial nerve paralysis, ataxia, dysarthria, and carpal tunnel syndromes.  In addition individuals can develop skin findings including cutis laxa dry, thin skin with advancing age,, Causing easy scarring.  Cardiac involvement includes arrhythmia, cardiomyopathy, proteinuria, renal problems are unless common.  Most severe presentation has been described in homozygous, show early onset of symptoms including nephrotic syndrome and end-stage renal failure,    Patient gave me the details list of her current symptoms also symptoms of mother and her maternal grandmother, who she thought might suffered the disease, I was able to  see the picture of them, and also  her paternal uncle, with apparent facial muscle weakness in his old age 64  Her current symptoms including photophobia, lattice corneal dystrophy, dry eye, wrist diagnosis of corrugate edema, cataract, is seeing ophthalmologist, also complains of thinning of eyebrows with age, reduced body hair, facial muscle weakness, loose thin skin, bruises easily, dry, sensitive, itchy, reduced sweating, history of carpal tunnel, poor balance, previous had transient abnormal EKG, was followed by cardiologist Dr. Tollie Eth, who has retired, also complains of word finding difficulties, MRI of the brain with without contrast in December 2022 showed posterior fossa large arachnoid cyst, otherwise no significant abnormality she also has receding gums, mild bilateral high-frequency hearing loss, also complains of random shooting pain,  She described her mother and maternal grandmother suffered a lot of similar symptoms,   PHYSICAL EXAM:   Vitals:   10/10/21 0955  BP: (!) 159/80  Pulse: 97  Weight: 132 lb (59.9 kg)  Height: 5' 7.2" (1.707 m)   Not recorded     Body mass index is 20.55 kg/m.  PHYSICAL EXAMNIATION:  Gen: NAD, conversant, well nourised, well groomed                     Cardiovascular: Regular rate rhythm, no peripheral edema, warm, nontender. Eyes: Conjunctivae clear without exudates or hemorrhage Neck: Supple, no carotid bruits. Pulmonary: Clear to auscultation bilaterally   NEUROLOGICAL EXAM:  MENTAL STATUS: Speech/cognition: awake, alert, oriented to history taking and casual conversation CRANIAL NERVES: CN II: Visual fields are full to confrontation. Pupils are round equal and briskly reactive to light. CN III, IV, VI: extraocular movement are normal. No ptosis. CN V: Facial sensation is intact to light touch, bilateral corneal reflex are present CN VII: Profound facial muscle weakness, there was no bilateral frontalis muscle movement,  facial asymmetry will smile, drawing right mouth corner towards the right side, frequent facial muscle twitching/fasciculation , there was also noticeable bilateral mentalis muscle fasciculations, CN VIII: Hearing is normal to causal conversation. CN IX, X: Phonation is normal. CN XI: Head turning and shoulder shrug are intact  MOTOR: There is no pronator drift of out-stretched arms. Muscle bulk and tone are normal. Muscle strength is normal.  REFLEXES: Reflexes are 1 and symmetric at the biceps, triceps, knees, and ankles. Plantar responses are flexor.  SENSORY: Intact to light touch, pinprick and vibratory sensation are intact in fingers and toes.  COORDINATION: There is no trunk or limb dysmetria noted.  GAIT/STANCE: Posture is normal. Gait is steady with normal steps, base, arm swing, and turning. Heel and toe walking are normal. Tandem gait is normal.  Romberg is absent.  REVIEW OF SYSTEMS:  Full 14 system review of systems performed and notable only for as above All other review of systems were negative.   ALLERGIES: Allergies  Allergen Reactions   Cefuroxime Diarrhea   Clarithromycin Diarrhea   Erythromycin Rash   Levofloxacin Diarrhea   Penicillins Rash    HOME MEDICATIONS: Current Outpatient Medications  Medication Sig Dispense Refill   CALCIUM PO Take 2 tablets by mouth daily.     Cholecalciferol 50 MCG (2000 UT) CAPS Take 1 tablet by mouth daily.     Coenzyme Q10 (CO Q-10 PO) Take 50 mg by mouth daily.     conjugated estrogens (PREMARIN) vaginal cream 1 application 2 (two) times a week.     Cyanocobalamin (VITAMIN B12) 1000 MCG TBCR Take 1 tablet by mouth 2 (two) times a week.     gabapentin (NEURONTIN) 100  MG capsule Take 1 capsule (100 mg total) by mouth 3 (three) times daily. 90 capsule 5   hypromellose (GENTEAL SEVERE) 0.3 % GEL ophthalmic ointment 1 Application as needed.     Multiple Vitamin (MULTI-VITAMIN DAILY) TABS Take 1 tablet by mouth daily.      Multiple Vitamins-Minerals (ZINC PO) Take 30 mg by mouth daily.     No current facility-administered medications for this visit.    PAST MEDICAL HISTORY: Past Medical History:  Diagnosis Date   Arachnoid cyst    Facial twitching    Peripheral neuropathy    Right bundle branch block    Vitamin D deficiency     PAST SURGICAL HISTORY: Past Surgical History:  Procedure Laterality Date   BREAST EXCISIONAL BIOPSY Left 01/18/2020   times 3   EXCISION BLOCKED SALIVARY GLAND     PILONIDAL CYST EXCISION     TONSILLECTOMY      FAMILY HISTORY: Family History  Problem Relation Age of Onset   COPD Mother    Lung cancer Father    Breast cancer Neg Hx     SOCIAL HISTORY: Social History   Socioeconomic History   Marital status: Married    Spouse name: Not on file   Number of children: 0   Years of education: college   Highest education level: Not on file  Occupational History   Occupation: Retired Nurse, children's  Tobacco Use   Smoking status: Never   Smokeless tobacco: Never  Substance and Sexual Activity   Alcohol use: Yes    Comment: occasional wine   Drug use: Never   Sexual activity: Not on file  Other Topics Concern   Not on file  Social History Narrative   Lives at home with husband.   Right-handed.   One cup coffee daily, occasional tea.   Social Determinants of Health   Financial Resource Strain: Not on file  Food Insecurity: Not on file  Transportation Needs: Not on file  Physical Activity: Not on file  Stress: Not on file  Social Connections: Not on file  Intimate Partner Violence: Not on file      Marcial Pacas, M.D. Ph.D.  [facility] [address], [place], [place] [address] Ph: [phone] Fax: [phone]  CC:  Kathyrn Lass, MD [place],  [place] [address]  Kathyrn Lass, MD    Total time spent reviewing the chart, obtaining history, examined patient, ordering tests, documentation, consultations and  family, care coordination was  40 minutes

## 2022-01-29 ENCOUNTER — Other Ambulatory Visit: Payer: Self-pay | Admitting: Neurology

## 2022-01-29 ENCOUNTER — Encounter: Payer: Self-pay | Admitting: Neurology

## 2022-01-30 NOTE — Telephone Encounter (Signed)
This encounter was created in error - please disregard.

## 2022-06-26 ENCOUNTER — Other Ambulatory Visit: Payer: Self-pay | Admitting: Sports Medicine

## 2022-06-26 DIAGNOSIS — M25569 Pain in unspecified knee: Secondary | ICD-10-CM

## 2022-07-03 ENCOUNTER — Ambulatory Visit
Admission: RE | Admit: 2022-07-03 | Discharge: 2022-07-03 | Disposition: A | Discharge status: Home / Self Care | Payer: Medicare Other | Source: Ambulatory Visit | Attending: Sports Medicine | Admitting: Sports Medicine

## 2022-07-03 DIAGNOSIS — M25569 Pain in unspecified knee: Secondary | ICD-10-CM

## 2022-07-29 ENCOUNTER — Other Ambulatory Visit: Payer: Self-pay | Admitting: Neurology

## 2022-07-30 ENCOUNTER — Other Ambulatory Visit: Payer: Self-pay | Admitting: Neurology

## 2022-07-30 ENCOUNTER — Encounter: Payer: Self-pay | Admitting: Neurology

## 2022-10-11 ENCOUNTER — Ambulatory Visit (INDEPENDENT_AMBULATORY_CARE_PROVIDER_SITE_OTHER): Payer: Medicare Other | Admitting: Neurology

## 2022-10-11 ENCOUNTER — Encounter: Payer: Self-pay | Admitting: Neurology

## 2022-10-11 VITALS — BP 125/68 | HR 82 | Ht 67.25 in | Wt 131.0 lb

## 2022-10-11 DIAGNOSIS — R253 Fasciculation: Secondary | ICD-10-CM

## 2022-10-11 DIAGNOSIS — R29810 Facial weakness: Secondary | ICD-10-CM

## 2022-10-11 MED ORDER — GABAPENTIN 100 MG PO CAPS
100.0000 mg | ORAL_CAPSULE | Freq: Three times a day (TID) | ORAL | 3 refills | Status: AC
Start: 2022-10-11 — End: ?

## 2022-10-11 NOTE — Progress Notes (Signed)
Chief Complaint  Patient presents with   Follow-up    Rm14, alone FACE WEAK/Muscle fasciculation:pt stated doing well, pt reported having difficulty breathing out of right nostril       ASSESSMENT AND PLAN  Kimberly Jacobs is a 67 y.o. female   Bilateral facial muscle twitching, Genetic confirming Gelsolin amyloid deposit  Her facial muscle fasciculation has been helped by gabapentin 100 mg 3 times daily, will continue current medication  Refilled her prescription,  Only return to clinic for new issues   DIAGNOSTIC DATA (LABS, IMAGING, TESTING) - I reviewed patient records, labs, notes, testing and imaging myself where available. Laboratory evaluation from [facility] February 2021 normal CBC, CMP, A1c 5.6  MEDICAL HISTORY:  Kimberly Jacobs is a 67 year old female, seen in request by her primary care physician Dr. Sigmund Hazel for evaluation of facial muscle twitching, weakness, initial evaluation was on January 11, 2021  I reviewed and summarized the referring note.PMhx.  She is a retired Biomedical scientist, more than 20 years ago, she developed left frontalis muscle weakness, could not raise her eyebrow, but denies left lower face weakness or sensory changes, later developed bilateral facial muscle twitching, was evaluated by Dr. Sharene Skeans in 2006, had MRI of the brain with without contrast, I personally reviewed the film, there is midline inferior posterior fossa cyst, consistent with benign arachnoid cyst, follow-up MRI with without contrast in June 2007 showed no significant change  She later developed right frontalis muscle weakness, could not raise up both eyebrows, over the years, she also developed intermittent muscle facial twitching, initially mainly involving facial muscles at bilateral cheek area, when she smiles, she noticed uncontrollable facial muscle twitching, improved by moving her facial muscle in certain ways  Over the years, facial muscle twitching become more  obvious, especially since 2022, she also noticed significant facial asymmetry while smile, has more movement at right face, drawing the right mouth, towards the right side, for a while, she also has frequent right lower lip biting, which improved by pain attention to it  She denies sensory change, denies dysarthria, swallowing, no double vision, complains of dry eye, but no sensory loss, has mild high-pitched hearing loss, long history of bilateral tinnitus  Also performed EMG at bilateral facial muscle today, there was noticeable chronic neuropathic changes at bilateral orbicularis oculi, zygomatic major, frequent fasciculations at bilateral mentalis,  Update October 10, 2021: She was seen by [facility] neurologist on Jul 06, 2021, Dr Jill Alexanders Mhoon, suspicious for Gelsolin variant amyloidosis, had invitae genetic testing 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 is pathologic, consistent with autosomal dominant amyloidosis, [place] Type, which is characterized by accumulation of gelsolin amyloid deposit in various tissues of the body, causing a range of clinical syndrome, which typically developing the third of fourth decades, including ophthalmological, dryness, irritability, sensitivity to light caused by amyloid deposit in the corneal (corneal lattice dystrophy CLD), over time, CLD and repetitive corneal erosion can lead to visual impairment, accumulation of amyloid deposits in nerve tissue can lead to polyneuropathy, facial nerve paralysis, ataxia, dysarthria, and carpal tunnel syndromes.  In addition individuals can develop skin findings including cutis laxa dry, thin skin with advancing age,, Causing easy scarring.  Cardiac involvement includes arrhythmia, cardiomyopathy, proteinuria, renal problems are unless common.  Most severe presentation has been described in homozygous, show early onset of symptoms including nephrotic syndrome and end-stage renal failure,    Patient gave me the details list of her  current symptoms also symptoms of mother and her  maternal grandmother, who she thought might suffered the disease, I was able to see the picture of them, and also her paternal uncle, with apparent facial muscle weakness in his old age 68  Her current symptoms including photophobia, lattice corneal dystrophy, dry eye, wrist diagnosis of corrugate edema, cataract, is seeing ophthalmologist, also complains of thinning of eyebrows with age, reduced body hair, facial muscle weakness, loose thin skin, bruises easily, dry, sensitive, itchy, reduced sweating, history of carpal tunnel, poor balance, previous had transient abnormal EKG, was followed by cardiologist Dr. Viann Fish, who has retired, also complains of word finding difficulties, MRI of the brain with without contrast in December 2022 showed posterior fossa large arachnoid cyst, otherwise no significant abnormality she also has receding gums, mild bilateral high-frequency hearing loss, also complains of random shooting pain,  She described her mother and maternal grandmother suffered a lot of similar symptoms,  UPDATE August 29th 2024: She is overall stable, retired, gabapentin 100 mg 3 times a day has helped her facial muscle spasm, overall active, denies significant difficulty handling daily activity,   PHYSICAL EXAM:   Vitals:   10/11/22 0939 10/11/22 0942  BP: (!) 152/75 125/68  Pulse: 82   Weight: 131 lb (59.4 kg)   Height: 5' 7.25" (1.708 m)    Not recorded     Body mass index is 20.37 kg/m.  PHYSICAL EXAMNIATION:  Gen: NAD, conversant, well nourised, well groomed                     Cardiovascular: Regular rate rhythm, no peripheral edema, warm, nontender. Eyes: Conjunctivae clear without exudates or hemorrhage Neck: Supple, no carotid bruits. Pulmonary: Clear to auscultation bilaterally   NEUROLOGICAL EXAM:  MENTAL STATUS: Speech/cognition: awake, alert, oriented to history taking and casual conversation CRANIAL  NERVES: CN II: Visual fields are full to confrontation. Pupils are round equal and briskly reactive to light. CN III, IV, VI: extraocular movement are normal. No ptosis. CN V: Facial sensation is intact to light touch, bilateral corneal reflex are present CN VII: Moderate to profound facial muscle weakness, there was no bilateral frontalis muscle movement, facial asymmetry will smile, drawing right mouth corner towards the right side, frequent facial muscle twitching/fasciculation , there was also noticeable bilateral mentalis muscle fasciculations, CN VIII: Hearing is normal to causal conversation. CN IX, X: Phonation is normal. CN XI: Head turning and shoulder shrug are intact  MOTOR: There is no pronator drift of out-stretched arms. Muscle bulk and tone are normal. Muscle strength is normal.  REFLEXES: Reflexes are 1 and symmetric at the biceps, triceps, knees, and ankles. Plantar responses are flexor.  SENSORY: Intact to light touch, pinprick and vibratory sensation are intact in fingers and toes.  COORDINATION: There is no trunk or limb dysmetria noted.  GAIT/STANCE: Posture is normal. Gait is steady   REVIEW OF SYSTEMS:  Full 14 system review of systems performed and notable only for as above All other review of systems were negative.   ALLERGIES: Allergies  Allergen Reactions   Zoster Vac Recomb Adjuvanted     Other Reaction(s): flu sx, swelling in arm, fever- Pt to call CDC about #2   Cefuroxime Diarrhea    Other Reaction(s): diarrhea   Clarithromycin Diarrhea    Other Reaction(s): diarrhea   Erythromycin Rash   Levofloxacin Diarrhea    Other Reaction(s): diarrhea   Penicillin G Rash   Penicillins Rash    HOME MEDICATIONS: Current Outpatient Medications  Medication Sig Dispense  Refill   CALCIUM PO Take 2 tablets by mouth daily.     cholecalciferol (VITAMIN D3) 25 MCG (1000 UNIT) tablet Take 1,000 Units by mouth daily.     Coenzyme Q10 (CO Q-10 PO) Take 50 mg  by mouth daily.     conjugated estrogens (PREMARIN) vaginal cream 1 application 2 (two) times a week.     gabapentin (NEURONTIN) 100 MG capsule TAKE ONE CAPSULE BY MOUTH THREE TIMES A DAY 90 capsule 5   hypromellose (GENTEAL SEVERE) 0.3 % GEL ophthalmic ointment 1 Application as needed.     Multiple Vitamin (MULTI-VITAMIN DAILY) TABS Take 1 tablet by mouth daily.     Multiple Vitamins-Minerals (ZINC PO) Take 30 mg by mouth daily.     No current facility-administered medications for this visit.    PAST MEDICAL HISTORY: Past Medical History:  Diagnosis Date   Arachnoid cyst    Facial twitching    Peripheral neuropathy    Right bundle branch block    Vitamin D deficiency     PAST SURGICAL HISTORY: Past Surgical History:  Procedure Laterality Date   BREAST EXCISIONAL BIOPSY Left 01/18/2020   times 3   EXCISION BLOCKED SALIVARY GLAND     PILONIDAL CYST EXCISION     TONSILLECTOMY      FAMILY HISTORY: Family History  Problem Relation Age of Onset   COPD Mother    Lung cancer Father    Breast cancer Neg Hx     SOCIAL HISTORY: Social History   Socioeconomic History   Marital status: Married    Spouse name: Not on file   Number of children: 0   Years of education: college   Highest education level: Not on file  Occupational History   Occupation: Retired Biomedical scientist  Tobacco Use   Smoking status: Never   Smokeless tobacco: Never  Substance and Sexual Activity   Alcohol use: Yes    Comment: occasional wine   Drug use: Never   Sexual activity: Not on file  Other Topics Concern   Not on file  Social History Narrative   Lives at home with husband.   Right-handed.   One cup coffee daily, occasional tea.   Social Determinants of Health   Financial Resource Strain: Not on file  Food Insecurity: Not on file  Transportation Needs: Not on file  Physical Activity: Not on file  Stress: Not on file  Social Connections: Not on file  Intimate Partner Violence: Not on  file      Levert Feinstein, M.D. Ph.D.  [facility] [address] [place], [place] [address] Ph: [phone] Fax: [phone]  CC:  Sigmund Hazel, MD [address] [place],  [place] [address]  Sigmund Hazel, MD    Total time spent reviewing the chart, obtaining history, examined patient, ordering tests, documentation, consultations and family, care coordination was  40 minutes

## 2022-10-19 ENCOUNTER — Other Ambulatory Visit: Payer: Self-pay | Admitting: Family Medicine

## 2022-10-19 DIAGNOSIS — Z1231 Encounter for screening mammogram for malignant neoplasm of breast: Secondary | ICD-10-CM

## 2022-10-30 DIAGNOSIS — Z1231 Encounter for screening mammogram for malignant neoplasm of breast: Secondary | ICD-10-CM

## 2022-11-27 ENCOUNTER — Ambulatory Visit
Admission: RE | Admit: 2022-11-27 | Discharge: 2022-11-27 | Disposition: A | Discharge status: Home / Self Care | Payer: Medicare Other | Source: Ambulatory Visit | Attending: Family Medicine | Admitting: Family Medicine

## 2022-11-27 DIAGNOSIS — Z1231 Encounter for screening mammogram for malignant neoplasm of breast: Secondary | ICD-10-CM

## 2023-11-11 IMAGING — DX DG HIP (WITH OR WITHOUT PELVIS) 2-3V*R*
2 series · 2 of 2 positions shown · non-contrast
Comparison: None.

CLINICAL DATA: Right hip pain, no known injury, initial encounter

EXAM:
DG HIP (WITH OR WITHOUT PELVIS) 2V RIGHT

[dg hip unilat w or w/o pelvis 2-3 views  (1 of 2)]
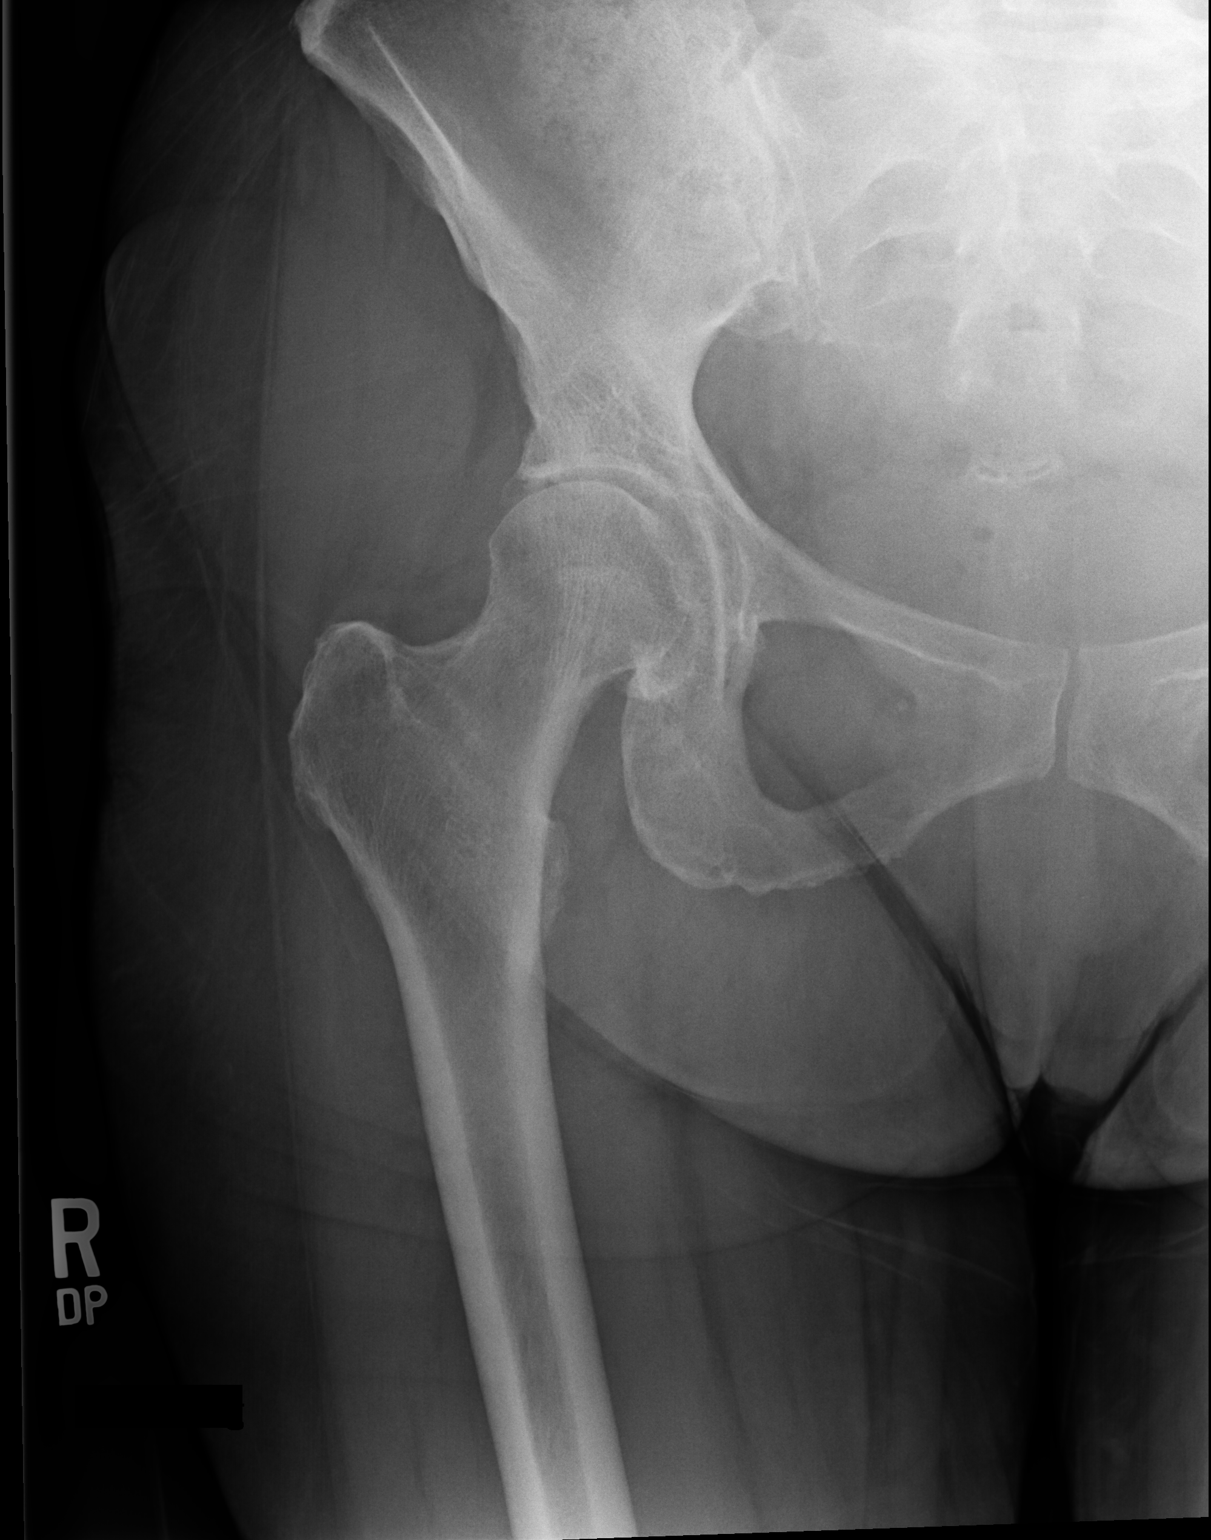

[dg hip unilat w or w/o pelvis 2-3 views  (2 of 2)]
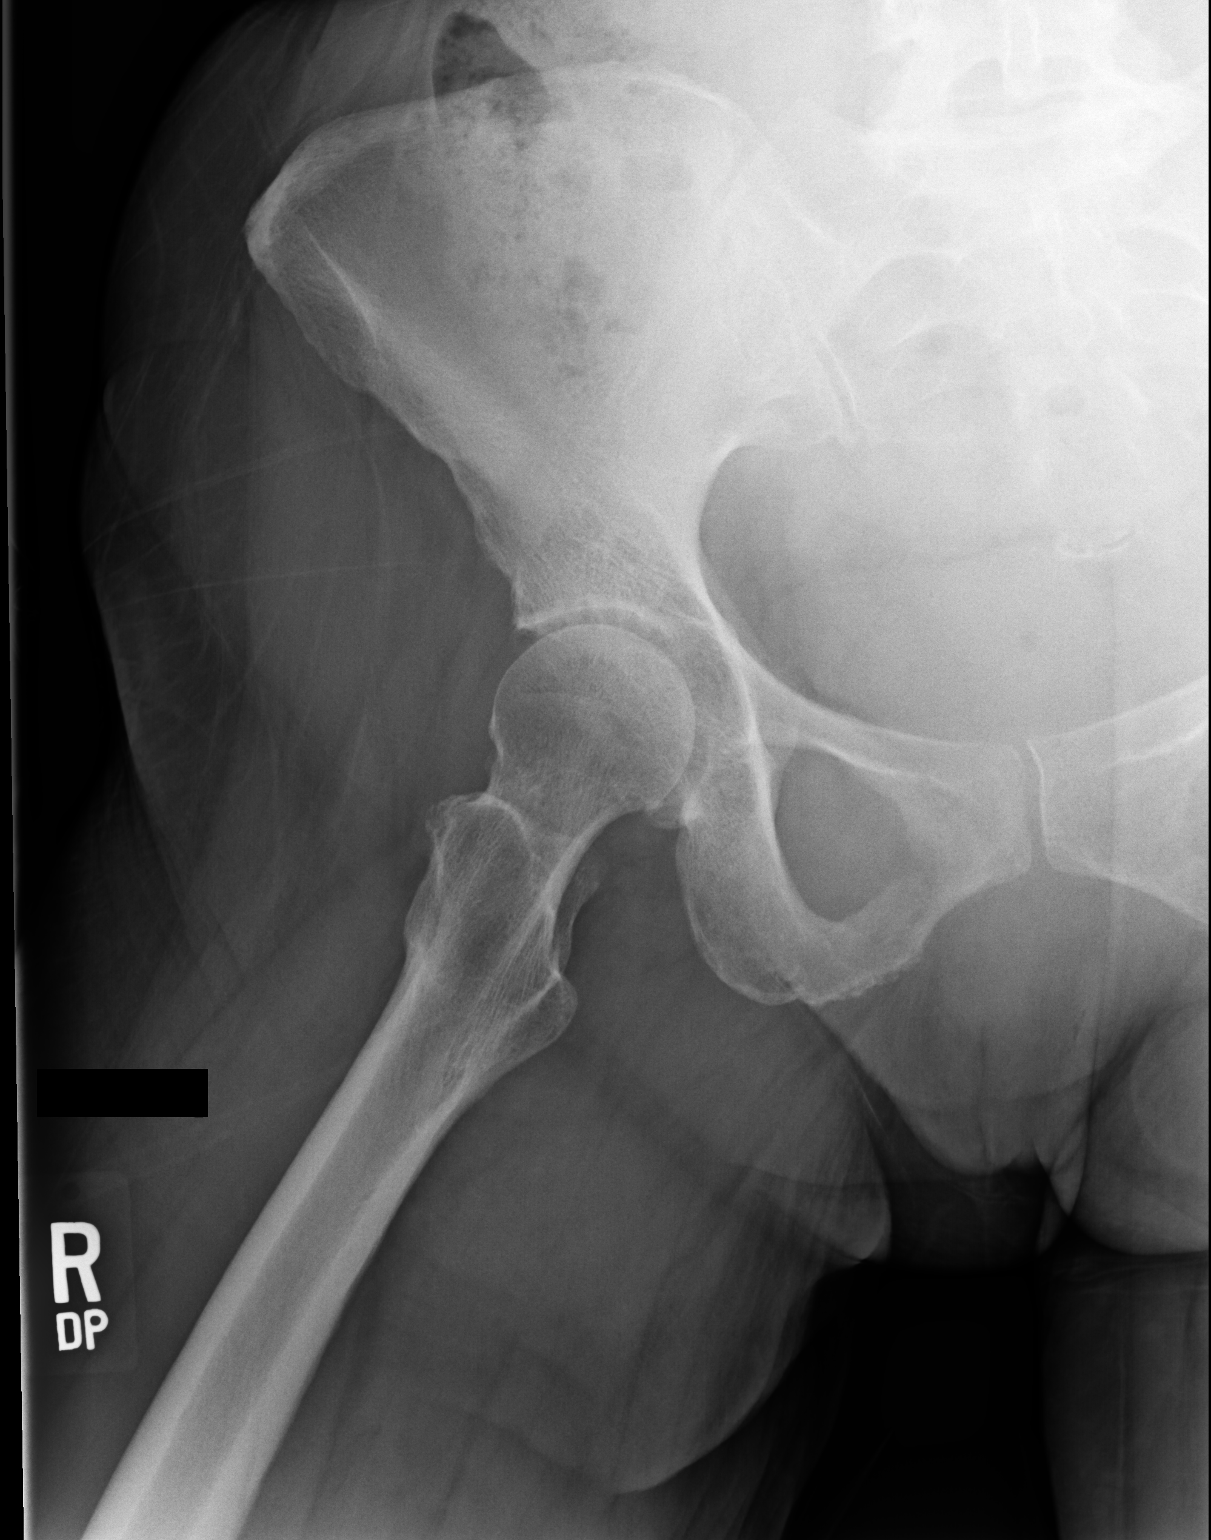

[2 of 2 positions shown; findings below may reference images not displayed]

FINDINGS: Visualized pelvic ring is intact. Degenerative changes of the right
hip joint are noted. No acute fracture or dislocation is seen. No
soft tissue abnormality is noted.
IMPRESSION: Degenerative change without acute abnormality.

## 2024-02-24 ENCOUNTER — Other Ambulatory Visit: Payer: Self-pay | Admitting: Family Medicine

## 2024-02-24 DIAGNOSIS — Z1231 Encounter for screening mammogram for malignant neoplasm of breast: Secondary | ICD-10-CM

## 2024-03-12 ENCOUNTER — Ambulatory Visit

## 2024-03-20 ENCOUNTER — Ambulatory Visit: Admission: RE | Admit: 2024-03-20 | Source: Ambulatory Visit

## 2024-03-20 DIAGNOSIS — Z1231 Encounter for screening mammogram for malignant neoplasm of breast: Secondary | ICD-10-CM
